# Patient Record
Sex: Female | Born: 1970 | Race: White | Hispanic: No | Smoking: Never smoker
Health system: Southern US, Community
[De-identification: ages and names within clinical notes are randomized; demographics above are authoritative.]

## PROBLEM LIST (undated history)

## (undated) DIAGNOSIS — Z803 Family history of malignant neoplasm of breast: Secondary | ICD-10-CM

## (undated) DIAGNOSIS — E611 Iron deficiency: Secondary | ICD-10-CM

## (undated) DIAGNOSIS — Z973 Presence of spectacles and contact lenses: Secondary | ICD-10-CM

## (undated) HISTORY — PX: TUBAL LIGATION: SHX77

## (undated) HISTORY — PX: WISDOM TOOTH EXTRACTION: SHX21

## (undated) HISTORY — DX: Family history of malignant neoplasm of breast: Z80.3

## (undated) HISTORY — PX: TONSILLECTOMY: SUR1361

---

## 2007-11-22 ENCOUNTER — Ambulatory Visit: Payer: Self-pay | Admitting: Internal Medicine

## 2012-06-07 ENCOUNTER — Ambulatory Visit: Payer: Self-pay | Admitting: Internal Medicine

## 2013-10-30 ENCOUNTER — Ambulatory Visit: Payer: Self-pay | Admitting: Physician Assistant

## 2016-11-03 ENCOUNTER — Other Ambulatory Visit: Payer: Self-pay | Admitting: Obstetrics & Gynecology

## 2016-11-03 DIAGNOSIS — Z1231 Encounter for screening mammogram for malignant neoplasm of breast: Secondary | ICD-10-CM

## 2016-11-04 ENCOUNTER — Ambulatory Visit
Admission: RE | Admit: 2016-11-04 | Discharge: 2016-11-04 | Disposition: A | Discharge status: Home / Self Care | Payer: 59 | Source: Ambulatory Visit | Attending: Obstetrics & Gynecology | Admitting: Obstetrics & Gynecology

## 2016-11-04 ENCOUNTER — Encounter: Payer: Self-pay | Admitting: Radiology

## 2016-11-04 DIAGNOSIS — Z1231 Encounter for screening mammogram for malignant neoplasm of breast: Secondary | ICD-10-CM | POA: Diagnosis not present

## 2016-12-23 ENCOUNTER — Encounter: Payer: Self-pay | Admitting: *Deleted

## 2016-12-23 ENCOUNTER — Ambulatory Visit
Admission: EM | Admit: 2016-12-23 | Discharge: 2016-12-23 | Disposition: A | Discharge status: Home / Self Care | Payer: 59 | Attending: Family Medicine | Admitting: Family Medicine

## 2016-12-23 DIAGNOSIS — H6123 Impacted cerumen, bilateral: Secondary | ICD-10-CM

## 2016-12-23 NOTE — ED Triage Notes (Signed)
Right ear impaction. Pt has previous hx of wax impactions.

## 2016-12-23 NOTE — ED Provider Notes (Signed)
[facility]    CSN: [id number] Arrival date & time: 12/23/16  1121     History   Chief Complaint Chief Complaint  Patient presents with  . Cerumen Impaction    HPI Jamie Higgins is a 46 y.o. female.   46 yo female with a c/o "plugged up ears". Denies any pain or drainage. States has recurrent problem of ear wax build up in ear canals.    The history is provided by the patient.    History reviewed. No pertinent past medical history.  There are no active problems to display for this patient.   Past Surgical History:  Procedure Laterality Date  . CESAREAN SECTION      OB History    No data available       Home Medications    Prior to Admission medications   Not on File    Family History History reviewed. No pertinent family history.  Social History Social History  Substance Use Topics  . Smoking status: Never Smoker  . Smokeless tobacco: Never Used  . Alcohol use Yes     Allergies   Patient has no known allergies.   Review of Systems Review of Systems   Physical Exam Triage Vital Signs ED Triage Vitals  Enc Vitals Group     BP 12/23/16 1127 124/83     Pulse Rate 12/23/16 1127 66     Resp 12/23/16 1127 16     Temp 12/23/16 1127 98.1 F (36.7 C)     Temp Source 12/23/16 1127 Oral     SpO2 12/23/16 1127 100 %     Weight 12/23/16 1129 160 lb (72.6 kg)     Height 12/23/16 1129 5\' 3"  (1.6 m)     Head Circumference --      Peak Flow --      Pain Score --      Pain Loc --      Pain Edu? --      Excl. in GC? --    No data found.   Updated Vital Signs BP 124/83 (BP Location: Left Arm)   Pulse 66   Temp 98.1 F (36.7 C) (Oral)   Resp 16   Ht 5\' 3"  (1.6 m)   Wt 160 lb (72.6 kg)   LMP 12/15/2016 (Exact Date)   SpO2 100%   BMI 28.34 kg/m   Visual Acuity Right Eye Distance:   Left Eye Distance:   Bilateral Distance:    Right Eye Near:   Left Eye Near:    Bilateral Near:     Physical Exam  Constitutional: She  appears well-developed and well-nourished.  HENT:  Head: Normocephalic and atraumatic.  Cerumen impaction bilaterally  Nursing note and vitals reviewed.    UC Treatments / Results  Labs (all labs ordered are listed, but only abnormal results are displayed) Labs Reviewed - No data to display  EKG  EKG Interpretation None       Radiology No results found.  Procedures .Ear Cerumen Removal Date/Time: 12/23/2016 12:18 PM Performed by: Payton MccallumONTY, Charnette Younkin Authorized by: Payton MccallumONTY, Salle Brandle   Consent:    Consent obtained:  Verbal   Consent given by:  Patient   Risks discussed:  Bleeding, pain, TM perforation and dizziness   Alternatives discussed:  No treatment Procedure details:    Location:  L ear and R ear   Procedure type: irrigation   Post-procedure details:    Inspection:  TM intact   Patient tolerance of procedure:  Tolerated well, no immediate complications   (including critical care time)  Medications Ordered in UC Medications - No data to display   Initial Impression / Assessment and Plan / UC Course  I have reviewed the triage vital signs and the nursing notes.  Pertinent labs & imaging results that were available during my care of the patient were reviewed by me and considered in my medical decision making (see chart for details).       Final Clinical Impressions(s) / UC Diagnoses   Final diagnoses:  Bilateral impacted cerumen    New Prescriptions There are no discharge medications for this patient.  1. diagnosis reviewed with patient 2. Ear lavage/irrigation as stated above; TMs visualized and clear after cerumen disimpaction 4. Follow-up prn    Payton Mccallum, MD 12/23/16 1220

## 2017-03-17 ENCOUNTER — Ambulatory Visit: Payer: Self-pay | Admitting: Obstetrics & Gynecology

## 2017-05-19 ENCOUNTER — Ambulatory Visit: Payer: Self-pay | Admitting: Obstetrics & Gynecology

## 2017-06-03 ENCOUNTER — Other Ambulatory Visit: Payer: Self-pay | Admitting: Obstetrics & Gynecology

## 2017-06-07 ENCOUNTER — Telehealth: Payer: Self-pay

## 2017-06-07 NOTE — Telephone Encounter (Signed)
Pt has appt next week c PH.  Would like to have refill of phentermine before then to try to get a jump on some wt loss before appt.  Please call.  [phone]

## 2017-06-07 NOTE — Telephone Encounter (Signed)
This medicine is Rx by pick up only (not able to fax or call).  Routine is to provide Rx at office visit.  Can pick up before then if desired (although waiting is preffered)

## 2017-06-07 NOTE — Telephone Encounter (Signed)
Pt aware and will wait

## 2017-06-14 ENCOUNTER — Encounter: Payer: Self-pay | Admitting: Obstetrics & Gynecology

## 2017-06-14 ENCOUNTER — Ambulatory Visit (INDEPENDENT_AMBULATORY_CARE_PROVIDER_SITE_OTHER): Payer: 59 | Admitting: Obstetrics & Gynecology

## 2017-06-14 VITALS — BP 120/80 | HR 76 | Ht 63.0 in | Wt 174.0 lb

## 2017-06-14 DIAGNOSIS — Z1211 Encounter for screening for malignant neoplasm of colon: Secondary | ICD-10-CM | POA: Diagnosis not present

## 2017-06-14 DIAGNOSIS — Z1239 Encounter for other screening for malignant neoplasm of breast: Secondary | ICD-10-CM

## 2017-06-14 DIAGNOSIS — E669 Obesity, unspecified: Secondary | ICD-10-CM

## 2017-06-14 DIAGNOSIS — Z01419 Encounter for gynecological examination (general) (routine) without abnormal findings: Secondary | ICD-10-CM | POA: Diagnosis not present

## 2017-06-14 DIAGNOSIS — Z0000 Encounter for general adult medical examination without abnormal findings: Secondary | ICD-10-CM

## 2017-06-14 DIAGNOSIS — R87612 Low grade squamous intraepithelial lesion on cytologic smear of cervix (LGSIL): Secondary | ICD-10-CM | POA: Diagnosis not present

## 2017-06-14 DIAGNOSIS — Z1231 Encounter for screening mammogram for malignant neoplasm of breast: Secondary | ICD-10-CM

## 2017-06-14 MED ORDER — PHENTERMINE HCL 37.5 MG PO TABS: 37.5000 mg | ORAL_TABLET | Freq: Every day | ORAL | 1 refills | Status: DC

## 2017-06-14 NOTE — Progress Notes (Signed)
HPI:      Jamie Higgins is a 46 y.o. [id number] who LMP was Patient's last menstrual period was 05/20/2017., she presents today for her annual examination. The patient has no complaints today. The patient is sexually active. Her last pap: approximate date 2017 and was abnormal: LGSIL (and has had prior LGSIL w Neg HPV in 2014,15,16; Colpo Bx in 2016 neg) and last mammogram: approximate date 10/2016 and was normal. The patient does perform self breast exams.  There is no notable family history of breast or ovarian cancer in her family.  The patient has regular exercise: yes.  The patient denies current symptoms of depression.    GYN History: Contraception: tubal ligation  PMHx: History reviewed. No pertinent past medical history. Past Surgical History:  Procedure Laterality Date  . CESAREAN SECTION    . TUBAL LIGATION     Family History  Problem Relation Age of Onset  . Breast cancer Father    Social History  Substance Use Topics  . Smoking status: Never Smoker  . Smokeless tobacco: Never Used  . Alcohol use Yes    Current Outpatient Prescriptions:  .  phentermine (ADIPEX-P) 37.5 MG tablet, Take 1 tablet (37.5 mg total) by mouth daily before breakfast., Disp: 30 tablet, Rfl: 1 Allergies: Patient has no known allergies.  Review of Systems  Constitutional: Negative for chills, fever and malaise/fatigue.  HENT: Negative for congestion, sinus pain and sore throat.   Eyes: Negative for blurred vision and pain.  Respiratory: Negative for cough and wheezing.   Cardiovascular: Negative for chest pain and leg swelling.  Gastrointestinal: Negative for abdominal pain, constipation, diarrhea, heartburn, nausea and vomiting.  Genitourinary: Negative for dysuria, frequency, hematuria and urgency.  Musculoskeletal: Negative for back pain, joint pain, myalgias and neck pain.  Skin: Negative for itching and rash.  Neurological: Negative for dizziness, tremors and weakness.    Endo/Heme/Allergies: Does not bruise/bleed easily.  Psychiatric/Behavioral: Negative for depression. The patient is not nervous/anxious and does not have insomnia.     Objective: BP 120/80   Pulse 76   Ht 5\' 3"  (1.6 m)   Wt 174 lb (78.9 kg)   LMP 05/20/2017   BMI 30.82 kg/m   Filed Weights   06/14/17 1042  Weight: 174 lb (78.9 kg)   Body mass index is 30.82 kg/m. Physical Exam  Constitutional: She is oriented to person, place, and time. She appears well-developed and well-nourished. No distress.  Genitourinary: Rectum normal, vagina normal and uterus normal. Pelvic exam was performed with patient supine. There is no rash or lesion on the right labia. There is no rash or lesion on the left labia. Vagina exhibits no lesion. No bleeding in the vagina. Right adnexum does not display mass and does not display tenderness. Left adnexum does not display mass and does not display tenderness. Cervix does not exhibit motion tenderness, lesion, friability or polyp.   Uterus is mobile and midaxial. Uterus is not enlarged or exhibiting a mass.  HENT:  Head: Normocephalic and atraumatic. Head is without laceration.  Right Ear: Hearing normal.  Left Ear: Hearing normal.  Nose: No epistaxis.  No foreign bodies.  Mouth/Throat: Uvula is midline, oropharynx is clear and moist and mucous membranes are normal.  Eyes: Pupils are equal, round, and reactive to light.  Neck: Normal range of motion. Neck supple. No thyromegaly present.  Cardiovascular: Normal rate and regular rhythm.  Exam reveals no gallop and no friction rub.   No murmur heard. Pulmonary/Chest:  Effort normal and breath sounds normal. No respiratory distress. She has no wheezes. Right breast exhibits no mass, no skin change and no tenderness. Left breast exhibits no mass, no skin change and no tenderness.  Abdominal: Soft. Bowel sounds are normal. She exhibits no distension. There is no tenderness. There is no rebound.  Musculoskeletal:  Normal range of motion.  Neurological: She is alert and oriented to person, place, and time. No cranial nerve deficit.  Skin: Skin is warm and dry.  Psychiatric: She has a normal mood and affect. Judgment normal.  Vitals reviewed.   Assessment:  ANNUAL EXAM 1. Annual physical exam   2. Obesity (BMI 30-39.9)   3. Screening for breast cancer   4. Screen for colon cancer   5. Low grade squamous intraepith lesion on cytologic smear cervix (lgsil)      Screening Plan:            1.  Cervical Screening-  Pap smear done today           Prior LGSIL for years           Colpo 2016 neg  2. Breast screening- Exam annually and mammogram>40 planned   3. Colonoscopy every 10 years, Hemoccult testing - after age 47  4. Labs Normal last year; repeat next year  5. Counseling for contraception: bilateral tubal ligation  Other:  1. Annual physical exam  2. Obesity (BMI 30-39.9) Phentermine discussed an described. Weight loss, diet, exercise, follow up  3. Screening for breast cancer - MM DIGITAL SCREENING BILATERAL; Future  4. Screen for colon cancer - Ambulatory referral to Gastroenterology  5. Low grade squamous intraepith lesion on cytologic smear cervix (lgsil) - Pap IG (Image Guided)    F/U  Return in about 2 months (around 08/15/2017) for Follow up.  Annamarie MajorPaul Higgins Faye, MD, Merlinda FrederickFACOG [facility], [facility] 06/14/2017  11:09 AM

## 2017-06-14 NOTE — Patient Instructions (Signed)
PAP every year Mammogram every year    Call [phone] next year

## 2017-06-18 LAB — PAP IG (IMAGE GUIDED): PAP Smear Comment: 0

## 2017-06-25 ENCOUNTER — Telehealth: Payer: Self-pay

## 2017-06-25 ENCOUNTER — Other Ambulatory Visit: Payer: Self-pay

## 2017-06-25 DIAGNOSIS — Z1211 Encounter for screening for malignant neoplasm of colon: Secondary | ICD-10-CM

## 2017-06-25 NOTE — Telephone Encounter (Signed)
Gastroenterology Pre-Procedure Review  Request Date: 08/16 Requesting Physician: Dr. Servando Snarewohl  PATIENT REVIEW QUESTIONS: The patient responded to the following health history questions as indicated:    1. Are you having any GI issues? no 2. Do you have a personal history of Polyps? no 3. Do you have a family history of Colon Cancer or Polyps? no 4. Diabetes Mellitus? no 5. Joint replacements in the past 12 months?no 6. Major health problems in the past 3 months?no 7. Any artificial heart valves, MVP, or defibrillator?no    MEDICATIONS & ALLERGIES:    Patient reports the following regarding taking any anticoagulation/antiplatelet therapy:   Plavix, Coumadin, Eliquis, Xarelto, Lovenox, Pradaxa, Brilinta, or Effient? no Aspirin? no  Patient confirms/reports the following medications:  Current Outpatient Prescriptions  Medication Sig Dispense Refill  . phentermine (ADIPEX-P) 37.5 MG tablet Take 1 tablet (37.5 mg total) by mouth daily before breakfast. 30 tablet 1   No current facility-administered medications for this visit.     Patient confirms/reports the following allergies:  No Known Allergies  No orders of the defined types were placed in this encounter.   AUTHORIZATION INFORMATION Primary Insurance: 1D#: Group #:  Secondary Insurance: 1D#: Group #:  SCHEDULE INFORMATION: Date: 07/15/17 Time: Location:[facility]

## 2017-06-30 ENCOUNTER — Encounter: Payer: Self-pay | Admitting: Obstetrics and Gynecology

## 2017-07-07 ENCOUNTER — Encounter: Payer: Self-pay | Admitting: *Deleted

## 2017-07-08 ENCOUNTER — Encounter: Payer: Self-pay | Admitting: Anesthesiology

## 2017-07-15 ENCOUNTER — Ambulatory Visit: Admission: RE | Admit: 2017-07-15 | Payer: 59 | Source: Ambulatory Visit | Admitting: Gastroenterology

## 2017-07-15 HISTORY — DX: Iron deficiency: E61.1

## 2017-07-15 HISTORY — DX: Presence of spectacles and contact lenses: Z97.3

## 2017-07-15 SURGERY — COLONOSCOPY WITH PROPOFOL
Anesthesia: Choice

## 2017-08-17 ENCOUNTER — Ambulatory Visit: Payer: 59 | Admitting: Obstetrics & Gynecology

## 2018-01-21 ENCOUNTER — Encounter: Payer: Self-pay | Admitting: Obstetrics & Gynecology

## 2018-01-21 ENCOUNTER — Ambulatory Visit (INDEPENDENT_AMBULATORY_CARE_PROVIDER_SITE_OTHER): Payer: Managed Care, Other (non HMO) | Admitting: Obstetrics & Gynecology

## 2018-01-21 VITALS — BP 120/80 | HR 70 | Ht 63.0 in | Wt 172.0 lb

## 2018-01-21 DIAGNOSIS — Z87410 Personal history of cervical dysplasia: Secondary | ICD-10-CM | POA: Diagnosis not present

## 2018-01-21 DIAGNOSIS — Z1231 Encounter for screening mammogram for malignant neoplasm of breast: Secondary | ICD-10-CM

## 2018-01-21 DIAGNOSIS — Z0000 Encounter for general adult medical examination without abnormal findings: Secondary | ICD-10-CM | POA: Diagnosis not present

## 2018-01-21 DIAGNOSIS — Z1239 Encounter for other screening for malignant neoplasm of breast: Secondary | ICD-10-CM

## 2018-01-21 MED ORDER — PHENTERMINE HCL 37.5 MG PO TABS: 37.5000 mg | ORAL_TABLET | Freq: Every day | ORAL | 1 refills | Status: DC

## 2018-01-21 NOTE — Patient Instructions (Addendum)
PAP every year Mammogram every year    Call [phone] to schedule at [facility] Colonoscopy every 10 years starting age 48 Labs yearly (with PCP)  Phentermine tablets or capsules What is this medicine? PHENTERMINE (FEN ter meen) decreases your appetite. It is used with a reduced calorie diet and exercise to help you lose weight. This medicine may be used for other purposes; ask your health care provider or pharmacist if you have questions. COMMON BRAND NAME(S): Adipex-P, Atti-Plex P, Atti-Plex P Spansule, Fastin, Lomaira, Pro-Fast, Tara-8 What should I tell my health care provider before I take this medicine? They need to know if you have any of these conditions: -agitation -glaucoma -heart disease -high blood pressure -history of substance abuse -lung disease called Primary Pulmonary Hypertension (PPH) -taken an MAOI like Carbex, Eldepryl, Marplan, Nardil, or Parnate in last 14 days -thyroid disease -an unusual or allergic reaction to phentermine, other medicines, foods, dyes, or preservatives -pregnant or trying to get pregnant -breast-feeding How should I use this medicine? Take this medicine by mouth with a glass of water. Follow the directions on the prescription label. The instructions for use may differ based on the product and dose you are taking. Avoid taking this medicine in the evening. It may interfere with sleep. Take your doses at regular intervals. Do not take your medicine more often than directed. Talk to your pediatrician regarding the use of this medicine in children. While this drug may be prescribed for children 17 years or older for selected conditions, precautions do apply. Overdosage: If you think you have taken too much of this medicine contact a poison control center or emergency room at once. NOTE: This medicine is only for you. Do not share this medicine with others. What if I miss a dose? If you miss a dose, take it as soon as you can. If it is  almost time for your next dose, take only that dose. Do not take double or extra doses. What may interact with this medicine? Do not take this medicine with any of the following medications: -duloxetine -MAOIs like Carbex, Eldepryl, Marplan, Nardil, and Parnate -medicines for colds or breathing difficulties like pseudoephedrine or phenylephrine -procarbazine -sibutramine -SSRIs like citalopram, escitalopram, fluoxetine, fluvoxamine, paroxetine, and sertraline -stimulants like dexmethylphenidate, methylphenidate or modafinil -venlafaxine This medicine may also interact with the following medications: -medicines for diabetes This list may not describe all possible interactions. Give your health care provider a list of all the medicines, herbs, non-prescription drugs, or dietary supplements you use. Also tell them if you smoke, drink alcohol, or use illegal drugs. Some items may interact with your medicine. What should I watch for while using this medicine? Notify your physician immediately if you become short of breath while doing your normal activities. Do not take this medicine within 6 hours of bedtime. It can keep you from getting to sleep. Avoid drinks that contain caffeine and try to stick to a regular bedtime every night. This medicine was intended to be used in addition to a healthy diet and exercise. The best results are achieved this way. This medicine is only indicated for short-term use. Eventually your weight loss may level out. At that point, the drug will only help you maintain your new weight. Do not increase or in any way change your dose without consulting your doctor. You may get drowsy or dizzy. Do not drive, use machinery, or do anything that needs mental alertness until you know how this medicine affects you. Do not stand or  sit up quickly, especially if you are an older patient. This reduces the risk of dizzy or fainting spells. Alcohol may increase dizziness and drowsiness.  Avoid alcoholic drinks. What side effects may I notice from receiving this medicine? Side effects that you should report to your doctor or health care professional as soon as possible: -chest pain, palpitations -depression or severe changes in mood -increased blood pressure -irritability -nervousness or restlessness -severe dizziness -shortness of breath -problems urinating -unusual swelling of the legs -vomiting Side effects that usually do not require medical attention (report to your doctor or health care professional if they continue or are bothersome): -blurred vision or other eye problems -changes in sexual ability or desire -constipation or diarrhea -difficulty sleeping -dry mouth or unpleasant taste -headache -nausea This list may not describe all possible side effects. Call your doctor for medical advice about side effects. You may report side effects to FDA at 1-800-FDA-1088. Where should I keep my medicine? Keep out of the reach of children. This medicine can be abused. Keep your medicine in a safe place to protect it from theft. Do not share this medicine with anyone. Selling or giving away this medicine is dangerous and against the law. This medicine may cause accidental overdose and death if taken by other adults, children, or pets. Mix any unused medicine with a substance like cat litter or coffee grounds. Then throw the medicine away in a sealed container like a sealed bag or a coffee can with a lid. Do not use the medicine after the expiration date. Store at room temperature between 20 and 25 degrees C (68 and 77 degrees F). Keep container tightly closed. NOTE: This sheet is a summary. It may not cover all possible information. If you have questions about this medicine, talk to your doctor, pharmacist, or health care provider.  2018 Elsevier/Gold Standard (2015-08-23 12:53:15)

## 2018-01-21 NOTE — Progress Notes (Signed)
   HPI:      [name] is a 47 y.o. [id number] who LMP was Patient's last menstrual period was 12/24/2017., she presents today for her annual examination. The patient has no complaints today, except WEIGHT GAIN and SPACING OUT PERIODS. The patient is sexually active. Her last pap: approximate date 2018 and was abnormal: ASCUS;  and 2017was abnormal: LGSIL (and has had prior LGSIL w Neg HPV in 2014,15,16; Colpo Bx in 2016 neg and last mammogram: approximate date 2017 and was normal. The patient does perform self breast exams.  There is no notable family history of breast or ovarian cancer in her family.  The patient has regular exercise: yes.  The patient denies current symptoms of depression.    GYN History: Contraception: tubal ligation  PMHx: Past Medical History:  Diagnosis Date  . Family history of breast cancer in female    genetic tesing letter sent 6/16, 8/18  . Low iron   . Wears contact lenses    Past Surgical History:  Procedure Laterality Date  . CESAREAN SECTION    . TUBAL LIGATION     Family History  Problem Relation Age of Onset  . Breast cancer Father    Social History   Tobacco Use  . Smoking status: Never Smoker  . Smokeless tobacco: Never Used  Substance Use Topics  . Alcohol use: Yes    Alcohol/week: 3.6 oz    Types: 6 Cans of beer per week  . Drug use: No    Current Outpatient Medications:  .  phentermine (ADIPEX-P) 37.5 MG tablet, Take 1 tablet (37.5 mg total) by mouth daily before breakfast., Disp: 30 tablet, Rfl: 1 Allergies: Patient has no known allergies.  ROS  Objective: BP 120/80   Pulse 70   Ht 5\' 3"  (1.6 m)   Wt 172 lb (78 kg)   LMP 12/24/2017   BMI 30.47 kg/m   Filed Weights   01/21/18 1324  Weight: 172 lb (78 kg)   Body mass index is 30.47 kg/m. OBGyn Exam  Assessment:  ANNUAL EXAM 1. Annual physical exam   2. Screening for breast cancer   3. History of cervical dysplasia      Screening Plan:            1.   Cervical Screening-  Pap smear done today  2. Breast screening- Exam annually and mammogram>40 planned   3. Colonoscopy every 10 years, Hemoccult testing - after age 48  4. Labs due next year  5. Weight gain, BMI 30.  Lifestyle measures discussed. Meds also discussed. Phentermine pros and cons  6. Monitor oligomenorrhea, related to perimenopause    F/U  Return in about 1 year (around 01/21/2019) for Annual.  Annamarie MajorPaul Harris, MD, Merlinda FrederickFACOG [facility], [facility] 01/21/2018  2:00 PM

## 2018-01-25 LAB — IGP, APTIMA HPV
HPV Aptima: NEGATIVE
PAP Smear Comment: 0

## 2018-02-04 ENCOUNTER — Ambulatory Visit: Payer: 59 | Admitting: Obstetrics & Gynecology

## 2018-02-08 ENCOUNTER — Encounter: Payer: Self-pay | Admitting: Obstetrics and Gynecology

## 2018-03-17 ENCOUNTER — Encounter: Payer: Self-pay | Admitting: Obstetrics & Gynecology

## 2018-03-17 ENCOUNTER — Ambulatory Visit (INDEPENDENT_AMBULATORY_CARE_PROVIDER_SITE_OTHER): Payer: Managed Care, Other (non HMO) | Admitting: Obstetrics & Gynecology

## 2018-03-17 ENCOUNTER — Ambulatory Visit: Payer: Managed Care, Other (non HMO) | Admitting: Obstetrics & Gynecology

## 2018-03-17 VITALS — BP 120/80 | Ht 63.0 in | Wt 156.0 lb

## 2018-03-17 DIAGNOSIS — E663 Overweight: Secondary | ICD-10-CM | POA: Diagnosis not present

## 2018-03-17 MED ORDER — PHENTERMINE HCL 37.5 MG PO TABS: 37.5000 mg | ORAL_TABLET | Freq: Every day | ORAL | 1 refills | Status: DC

## 2018-03-17 NOTE — Progress Notes (Signed)
  History of Present Illness:  Jamie Higgins is a 47 y.o. who was started on Phentermine approximately 2 months ago due to obesity/abnormal weight gain. The patient has lost 12 pounds over the past 2 mos due to lifestyle changes and meds..   She has these side effects: none.  PMHx: She  has a past medical history of Family history of breast cancer in female, Low iron, and Wears contact lenses. Also,  has a past surgical history that includes Cesarean section and Tubal ligation., family history includes Breast cancer (age of onset: 6160) in her father.,  reports that she has never smoked. She has never used smokeless tobacco. She reports that she drinks about 3.6 oz of alcohol per week. She reports that she does not use drugs.  She has a current medication list which includes the following prescription(s): phentermine. Also, has No Known Allergies.  Review of Systems  All other systems reviewed and are negative.  Physical Exam:  BP 120/80   Ht 5\' 3"  (1.6 m)   Wt 156 lb (70.8 kg)   LMP 03/11/2018   BMI 27.63 kg/m  Body mass index is 27.63 kg/m. Filed Weights   03/17/18 1607  Weight: 156 lb (70.8 kg)   Physical Exam  Constitutional: She is oriented to person, place, and time. She appears well-developed and well-nourished. No distress.  Musculoskeletal: Normal range of motion.  Neurological: She is alert and oriented to person, place, and time.  Skin: Skin is warm and dry.  Psychiatric: She has a normal mood and affect.  Vitals reviewed.  Assessment: overweight Medication treatment is going very well for her.  Plan: Patient is continued/added to prescription appetite suppressants: PHENTERMINE and self-directed dieting.   Will continue to assist patient in incorporating positive experiences into her life to promote a positive mental attitude.  Education given regarding appropriate lifestyle changes for weight loss, including regular physical activity, healthy coping strategies, caloric  restriction, and healthy eating patterns.  The risks and benefits as well as side effects of medication, such as Phenteramine or Tenuate, is discussed.  The pros and cons of suppressing appetite and boosting metabolism is counseled.  Risks of tolerance and addiction discussed.  Use of medicine will be short term.  Pt to call with any negative side effects and agrees to keep follow up appointments.  Will stop after 2 more mos of therapy  A total of 15 minutes were spent face-to-face with the patient during this encounter and over half of that time dealt with counseling and coordination of care.  Annamarie MajorPaul Burgandy Hackworth, MD, Merlinda FrederickFACOG [facility], [facility] 03/17/2018  4:24 PM

## 2018-04-26 ENCOUNTER — Telehealth: Payer: Self-pay

## 2018-04-26 NOTE — Telephone Encounter (Signed)
-----   Message from Nadara Mustard, MD sent at 04/26/2018 11:09 AM EDT ----- Regarding: MMG Received notice she has not received MMG yet as ordered at her Annual. Please check and encourage her to do this, and document conversation.

## 2018-04-26 NOTE — Telephone Encounter (Signed)
Pt states she will have her mammo done when school is out. She thanks [place] for the reminder

## 2018-08-02 ENCOUNTER — Telehealth: Payer: Self-pay

## 2018-08-02 NOTE — Telephone Encounter (Signed)
Pt aware she needs to schedule her mammo

## 2018-08-02 NOTE — Telephone Encounter (Signed)
-----   Message from Nadara Mustard, MD sent at 08/02/2018  9:47 AM EDT ----- Regarding: MMG again Received notice she has not received MMG yet as ordered at her Annual. Please check and encourage her to do this, and document conversation.

## 2018-08-17 ENCOUNTER — Ambulatory Visit
Admission: RE | Admit: 2018-08-17 | Discharge: 2018-08-17 | Disposition: A | Discharge status: Home / Self Care | Payer: Managed Care, Other (non HMO) | Source: Ambulatory Visit | Attending: Obstetrics & Gynecology | Admitting: Obstetrics & Gynecology

## 2018-08-17 DIAGNOSIS — Z1239 Encounter for other screening for malignant neoplasm of breast: Secondary | ICD-10-CM

## 2018-08-17 DIAGNOSIS — Z1231 Encounter for screening mammogram for malignant neoplasm of breast: Secondary | ICD-10-CM | POA: Insufficient documentation

## 2019-01-23 ENCOUNTER — Ambulatory Visit (INDEPENDENT_AMBULATORY_CARE_PROVIDER_SITE_OTHER): Payer: Managed Care, Other (non HMO) | Admitting: Obstetrics & Gynecology

## 2019-01-23 ENCOUNTER — Other Ambulatory Visit (HOSPITAL_COMMUNITY)
Admission: RE | Admit: 2019-01-23 | Discharge: 2019-01-23 | Disposition: A | Discharge status: Home / Self Care | Payer: Managed Care, Other (non HMO) | Source: Ambulatory Visit | Attending: Obstetrics & Gynecology | Admitting: Obstetrics & Gynecology

## 2019-01-23 ENCOUNTER — Encounter: Payer: Self-pay | Admitting: Obstetrics & Gynecology

## 2019-01-23 VITALS — BP 118/80 | Ht 63.0 in | Wt 183.0 lb

## 2019-01-23 DIAGNOSIS — Z01419 Encounter for gynecological examination (general) (routine) without abnormal findings: Secondary | ICD-10-CM

## 2019-01-23 DIAGNOSIS — Z87410 Personal history of cervical dysplasia: Secondary | ICD-10-CM

## 2019-01-23 DIAGNOSIS — Z1239 Encounter for other screening for malignant neoplasm of breast: Secondary | ICD-10-CM

## 2019-01-23 MED ORDER — PHENTERMINE HCL 37.5 MG PO TABS: 37.5000 mg | ORAL_TABLET | Freq: Every day | ORAL | 1 refills | Status: DC

## 2019-01-23 NOTE — Patient Instructions (Signed)
PAP every year Mammogram every year    Call [phone] to schedule at [place] [facility] yearly (with PCP)   

## 2019-01-23 NOTE — Progress Notes (Signed)
HPI:      Jamie Higgins is a 48 y.o. [id number] who LMP was Patient's last menstrual period was 10/21/2018., she presents today for her annual examination. The patient has no complaints today. The patient is sexually active. Her last pap: approximate date 2019 and was normal and prior ASCUS 2018, LGSIL 2014-2017, Colpo/Bx 2016; and last mammogram: approximate date 07/2018 and was normal. The patient does perform self breast exams.  There is no notable family history of breast or ovarian cancer in her family.  The patient has regular exercise: yes.  The patient denies current symptoms of depression.  Occas period (2-3 per year); mild night sweats.  Pt reports weight gain every winter (30-40 lbs from baseline 140s in the summer).  Phentermine has helped, also better diet practices and exercise routine.  GYN History: Contraception: tubal ligation  PMHx: Past Medical History:  Diagnosis Date  . Family history of breast cancer in female    genetic tesing letter sent 6/16, 8/18  . Low iron   . Wears contact lenses    Past Surgical History:  Procedure Laterality Date  . CESAREAN SECTION    . TONSILLECTOMY    . TUBAL LIGATION    . WISDOM TOOTH EXTRACTION     Family History  Problem Relation Age of Onset  . Breast cancer Father 68   Social History   Tobacco Use  . Smoking status: Never Smoker  . Smokeless tobacco: Never Used  Substance Use Topics  . Alcohol use: Yes    Alcohol/week: 6.0 standard drinks    Types: 6 Cans of beer per week  . Drug use: No    Current Outpatient Medications:  .  phentermine (ADIPEX-P) 37.5 MG tablet, Take 1 tablet (37.5 mg total) by mouth daily before breakfast. (Patient not taking: Reported on 01/23/2019), Disp: 30 tablet, Rfl: 1 Allergies: Patient has no known allergies.  Review of Systems  Constitutional: Negative for chills, fever and malaise/fatigue.  HENT: Negative for congestion, sinus pain and sore throat.   Eyes: Negative for blurred vision  and pain.  Respiratory: Negative for cough and wheezing.   Cardiovascular: Negative for chest pain and leg swelling.  Gastrointestinal: Negative for abdominal pain, constipation, diarrhea, heartburn, nausea and vomiting.  Genitourinary: Negative for dysuria, frequency, hematuria and urgency.  Musculoskeletal: Negative for back pain, joint pain, myalgias and neck pain.  Skin: Negative for itching and rash.  Neurological: Negative for dizziness, tremors and weakness.  Endo/Heme/Allergies: Does not bruise/bleed easily.  Psychiatric/Behavioral: Negative for depression. The patient is not nervous/anxious and does not have insomnia.     Objective: BP 118/80   Ht 5\' 3"  (1.6 m)   Wt 183 lb (83 kg)   LMP 10/21/2018   BMI 32.42 kg/m   Filed Weights   01/23/19 0957  Weight: 183 lb (83 kg)   Body mass index is 32.42 kg/m. Physical Exam Constitutional:      General: She is not in acute distress.    Appearance: She is well-developed.  Genitourinary:     Pelvic exam was performed with patient supine.     Vagina, uterus and rectum normal.     No lesions in the vagina.     No vaginal bleeding.     No cervical motion tenderness, friability, lesion or polyp.     Uterus is mobile.     Uterus is not enlarged.     No uterine mass detected.    Uterus is midaxial.  No right or left adnexal mass present.     Right adnexa not tender.     Left adnexa not tender.  HENT:     Head: Normocephalic and atraumatic. No laceration.     Right Ear: Hearing normal.     Left Ear: Hearing normal.     Mouth/Throat:     Pharynx: Uvula midline.  Eyes:     Pupils: Pupils are equal, round, and reactive to light.  Neck:     Musculoskeletal: Normal range of motion and neck supple.     Thyroid: No thyromegaly.  Cardiovascular:     Rate and Rhythm: Normal rate and regular rhythm.     Heart sounds: No murmur. No friction rub. No gallop.   Pulmonary:     Effort: Pulmonary effort is normal. No respiratory  distress.     Breath sounds: Normal breath sounds. No wheezing.  Chest:     Breasts:        Right: No mass, skin change or tenderness.        Left: No mass, skin change or tenderness.  Abdominal:     General: Bowel sounds are normal. There is no distension.     Palpations: Abdomen is soft.     Tenderness: There is no abdominal tenderness. There is no rebound.  Musculoskeletal: Normal range of motion.  Neurological:     Mental Status: She is alert and oriented to person, place, and time.     Cranial Nerves: No cranial nerve deficit.  Skin:    General: Skin is warm and dry.  Psychiatric:        Judgment: Judgment normal.  Vitals signs reviewed.   Assessment:  ANNUAL EXAM 1. Women's annual routine gynecological examination   2. Screening for breast cancer   3. History of cervical dysplasia    Screening Plan:            1.  Cervical Screening-  Pap smear done today as has history of cervical dysplasia  2. Breast screening- Exam annually and mammogram>40 planned   3. Colonoscopy every 10 years, Hemoccult testing - after age 50  4. Labs managed by PCP  5. Counseling for contraception: bilateral tubal ligation   6. Weight gain, counseled on better routines. Phentermine 2 mos Rx.    F/U  Return in about 1 year (around 01/24/2020) for Annual.  Annamarie Major, MD, [facility], [facility] 01/23/2019  10:24 AM

## 2019-01-25 LAB — CYTOLOGY - PAP

## 2019-01-26 NOTE — Progress Notes (Signed)
PAP LGSIL (history of this), so plan repeat PAP in 6 months.  Pt reassured.  [place] Dec, please sch follow up appt in August please.  She is aware you will  be calling

## 2019-01-27 ENCOUNTER — Telehealth: Payer: Self-pay | Admitting: Obstetrics & Gynecology

## 2019-01-27 NOTE — Telephone Encounter (Signed)
Patient is schedule 07/06/19 with [facility] °

## 2019-01-27 NOTE — Telephone Encounter (Signed)
-----   Message from Nadara Mustard, MD sent at 01/26/2019  7:34 AM EST ----- PAP LGSIL (history of this), so plan repeat PAP in 6 months.  Pt reassured.  [place] Dec, please sch follow up appt in August please.  She is aware you will  be calling

## 2019-04-11 ENCOUNTER — Encounter: Payer: Self-pay | Admitting: Obstetrics & Gynecology

## 2019-04-11 ENCOUNTER — Other Ambulatory Visit: Payer: Self-pay | Admitting: Obstetrics & Gynecology

## 2019-04-11 DIAGNOSIS — Z9189 Other specified personal risk factors, not elsewhere classified: Secondary | ICD-10-CM

## 2019-04-11 DIAGNOSIS — E663 Overweight: Secondary | ICD-10-CM

## 2019-04-11 MED ORDER — PHENTERMINE HCL 37.5 MG PO TABS: 37.5000 mg | ORAL_TABLET | Freq: Every day | ORAL | 0 refills | Status: DC

## 2019-04-11 NOTE — Progress Notes (Signed)
Phone call: Request to restart Phentermine Weight today 168 at home Tolerated in past Plan f/u weight check and BP check one month  Annamarie Major, MD, [facility], [facility] 04/11/2019  2:33 PM

## 2019-04-13 ENCOUNTER — Encounter: Payer: Self-pay | Admitting: Obstetrics & Gynecology

## 2019-04-13 ENCOUNTER — Other Ambulatory Visit: Payer: Managed Care, Other (non HMO)

## 2019-04-26 ENCOUNTER — Other Ambulatory Visit: Payer: Self-pay

## 2019-04-26 ENCOUNTER — Ambulatory Visit
Admission: EM | Admit: 2019-04-26 | Discharge: 2019-04-26 | Disposition: A | Discharge status: Home / Self Care | Payer: Managed Care, Other (non HMO) | Attending: Emergency Medicine | Admitting: Emergency Medicine

## 2019-04-26 ENCOUNTER — Encounter: Payer: Self-pay | Admitting: Emergency Medicine

## 2019-04-26 DIAGNOSIS — H6121 Impacted cerumen, right ear: Secondary | ICD-10-CM | POA: Diagnosis not present

## 2019-04-26 NOTE — ED Triage Notes (Signed)
Patient states that she has wax impaction in her right ear that started over the weekend.  Patient c/o pain in her right ear.

## 2019-04-26 NOTE — ED Provider Notes (Signed)
[facility] ____________________________________________  Time seen: Approximately 8:53 AM  I have reviewed the triage vital signs and the nursing notes.   HISTORY  Chief Complaint Cerumen Impaction   HPI Jamie Higgins is a 48 y.o. female presenting for evaluation of possible earwax buildup.  Patient reports this is a recurrent issue for her.  States over the weekend she started to feel like she was having wax buildup in her right ear and now she has muffled hearing and some tenderness.  Has started using over-the-counter Debrox.  Denies cough, congestion, nasal drainage.  No injury.  No recent fever.  Reports otherwise doing well denies other complaints.    Past Medical History:  Diagnosis Date  . Family history of breast cancer in female    genetic tesing letter sent 6/16, 8/18  . Low iron   . Wears contact lenses     Patient Active Problem List   Diagnosis Date Noted  . Overweight (BMI 25.0-29.9) 03/17/2018  . History of cervical dysplasia 01/21/2018  . Obesity (BMI 30-39.9) 06/14/2017  . Low grade squamous intraepith lesion on cytologic smear cervix (lgsil) 06/14/2017    Past Surgical History:  Procedure Laterality Date  . CESAREAN SECTION    . TONSILLECTOMY    . TUBAL LIGATION    . WISDOM TOOTH EXTRACTION       No current facility-administered medications for this encounter.   Current Outpatient Medications:  .  phentermine (ADIPEX-P) 37.5 MG tablet, Take 1 tablet (37.5 mg total) by mouth daily before breakfast., Disp: 30 tablet, Rfl: 0  Allergies Patient has no known allergies.  Family History  Problem Relation Age of Onset  . Breast cancer Father 14    Social History Social History   Tobacco Use  . Smoking status: Never Smoker  . Smokeless tobacco: Never Used  Substance Use Topics  . Alcohol use: Yes    Alcohol/week: 6.0 standard drinks    Types: 6 Cans of beer per week  . Drug use: No    Review of Systems Constitutional: No  fever ENT: No sore throat. As above. Cardiovascular: Denies chest pain. Respiratory: Denies shortness of breath. Gastrointestinal: No abdominal pain.  Skin: Negative for rash.   ____________________________________________   PHYSICAL EXAM:  VITAL SIGNS: ED Triage Vitals  Enc Vitals Group     BP 04/26/19 0840 126/83     Pulse Rate 04/26/19 0840 80     Resp 04/26/19 0840 14     Temp 04/26/19 0840 98.2 F (36.8 C)     Temp Source 04/26/19 0840 Oral     SpO2 04/26/19 0840 100 %     Weight 04/26/19 0837 168 lb (76.2 kg)     Height 04/26/19 0837 5\' 3"  (1.6 m)     Head Circumference --      Peak Flow --      Pain Score 04/26/19 0837 5     Pain Loc --      Pain Edu? --      Excl. in GC? --     Constitutional: Alert and oriented. Well appearing and in no acute distress. Eyes: Conjunctivae are normal. PERRL. EOMI. Head: Atraumatic. No sinus tenderness to palpation. No swelling. No erythema.  Ears:   Left: Nontender, mild cerumen in canal, no erythema, normal TM  Right: Nontender, total cerumen impaction, post cerumen removal normal canal, no erythema, normal TM.  Nose:No nasal congestion. Neck: No stridor.  No cervical spine tenderness to palpation. Hematological/Lymphatic/Immunilogical: No cervical lymphadenopathy.  Cardiovascular: Normal rate, regular rhythm. Grossly normal heart sounds.  Good peripheral circulation. Respiratory: Normal respiratory effort.  No retractions. No wheezes, rales or rhonchi. Good air movement.  Musculoskeletal: Ambulatory with steady gait. Neurologic:  Normal speech and language. No gait instability. Skin:  Skin appears warm, dry and intact. No rash noted. Psychiatric: Mood and affect are normal. Speech and behavior are normal.  ___________________________________________   LABS (all labs ordered are listed, but only abnormal results are displayed)  Labs Reviewed - No data to display  PROCEDURES Procedures   Cerumen removal Right ear  total cerumen impaction.  Removed by irrigation by RN.  Patient tolerated well.  INITIAL IMPRESSION / ASSESSMENT AND PLAN / ED COURSE  Pertinent labs & imaging results that were available during my care of the patient were reviewed by me and considered in my medical decision making (see chart for details).  Presented for cerumen impaction.  Cerumen removed.  Patient tolerated well.  Encourage supportive care.  Discussed follow up with Primary care physician this week. Discussed follow up and return parameters including no resolution or any worsening concerns. Patient verbalized understanding and agreed to plan.   ____________________________________________   FINAL CLINICAL IMPRESSION(S) / ED DIAGNOSES  Final diagnoses:  Impacted cerumen of right ear     ED Discharge Orders    None       Note: This dictation was prepared with Dragon dictation along with smaller phrase technology. Any transcriptional errors that result from this process are unintentional.         Renford DillsMiller, Terilyn Sano, NP 04/26/19 [phone]

## 2019-07-05 ENCOUNTER — Encounter: Payer: Self-pay | Admitting: Obstetrics & Gynecology

## 2019-07-06 ENCOUNTER — Ambulatory Visit: Payer: Managed Care, Other (non HMO) | Admitting: Obstetrics & Gynecology

## 2019-07-14 ENCOUNTER — Other Ambulatory Visit: Payer: Self-pay

## 2019-07-14 ENCOUNTER — Encounter: Payer: Self-pay | Admitting: Obstetrics & Gynecology

## 2019-07-14 ENCOUNTER — Other Ambulatory Visit (HOSPITAL_COMMUNITY)
Admission: RE | Admit: 2019-07-14 | Discharge: 2019-07-14 | Disposition: A | Discharge status: Home / Self Care | Payer: Managed Care, Other (non HMO) | Source: Ambulatory Visit | Attending: Obstetrics & Gynecology | Admitting: Obstetrics & Gynecology

## 2019-07-14 ENCOUNTER — Ambulatory Visit (INDEPENDENT_AMBULATORY_CARE_PROVIDER_SITE_OTHER): Payer: Managed Care, Other (non HMO) | Admitting: Obstetrics & Gynecology

## 2019-07-14 VITALS — BP 120/80 | Ht 63.0 in | Wt 178.0 lb

## 2019-07-14 DIAGNOSIS — Z87410 Personal history of cervical dysplasia: Secondary | ICD-10-CM | POA: Insufficient documentation

## 2019-07-14 NOTE — Progress Notes (Signed)
  HPI:  Patient is a 48 y.o. [id number] presenting for follow up evaluation of abnormal PAP smear in the past.  Her last PAP was 6 months ago and was LGSIL; and prior to that was approximate date 12/2017 and was normal,  and prior to that 2018 ASCUS. She has had a prior colposcopy then. Prior biopsies (if done) were Normal.  No prior LEEP.  PMHx: She  has a past medical history of Family history of breast cancer in female, Low iron, and Wears contact lenses. Also,  has a past surgical history that includes Cesarean section; Tubal ligation; Tonsillectomy; and Wisdom tooth extraction., family history includes Breast cancer (age of onset: 45) in her father.,  reports that she has never smoked. She has never used smokeless tobacco. She reports current alcohol use of about 6.0 standard drinks of alcohol per week. She reports that she does not use drugs.  She currently has no medications in their medication list. Also, has No Known Allergies.  Review of Systems  All other systems reviewed and are negative.   Objective: BP 120/80   Ht 5\' 3"  (1.6 m)   Wt 178 lb (80.7 kg)   BMI 31.53 kg/m  Filed Weights   07/14/19 1540  Weight: 178 lb (80.7 kg)   Body mass index is 31.53 kg/m.  Physical examination Physical Exam Constitutional:      General: She is not in acute distress.    Appearance: She is well-developed.  Genitourinary:     Pelvic exam was performed with patient supine.     Vagina and uterus normal.     No vaginal erythema or bleeding.     No cervical motion tenderness, discharge, polyp or nabothian cyst.     Uterus is mobile.     Uterus is not enlarged.     No uterine mass detected.    Uterus is midaxial.     No right or left adnexal mass present.     Right adnexa not tender.     Left adnexa not tender.  HENT:     Head: Normocephalic and atraumatic.     Nose: Nose normal.  Abdominal:     General: There is no distension.     Palpations: Abdomen is soft.     Tenderness: There is no  abdominal tenderness.  Musculoskeletal: Normal range of motion.  Neurological:     Mental Status: She is alert and oriented to person, place, and time.     Cranial Nerves: No cranial nerve deficit.  Skin:    General: Skin is warm and dry.     ASSESSMENT:  History of Cervical Dysplasia  Plan:  1.  I discussed the grading system of pap smears and HPV high risk viral types.   2. Follow up PAP 6 months, vs intervention if high grade dysplasia identified. 3. Treatment of persistantly abnormal PAP smears and cervical dysplasia, even mild, is discussed w pt today in detail, as well as the pros and cons of Cryo and LEEP procedures. Will consider and discuss after results.   Barnett Applebaum, MD, [facility], [facility] 07/14/2019  4:03 PM

## 2019-07-18 LAB — CYTOLOGY - PAP: Diagnosis: UNDETERMINED — AB

## 2019-09-11 ENCOUNTER — Ambulatory Visit
Admission: RE | Admit: 2019-09-11 | Discharge: 2019-09-11 | Disposition: A | Discharge status: Home / Self Care | Payer: Managed Care, Other (non HMO) | Source: Ambulatory Visit | Attending: Obstetrics & Gynecology | Admitting: Obstetrics & Gynecology

## 2019-09-11 ENCOUNTER — Other Ambulatory Visit: Payer: Self-pay

## 2019-09-11 DIAGNOSIS — Z1239 Encounter for other screening for malignant neoplasm of breast: Secondary | ICD-10-CM | POA: Diagnosis present

## 2019-09-11 DIAGNOSIS — Z1231 Encounter for screening mammogram for malignant neoplasm of breast: Secondary | ICD-10-CM | POA: Insufficient documentation

## 2019-10-02 ENCOUNTER — Other Ambulatory Visit: Payer: Self-pay | Admitting: Obstetrics & Gynecology

## 2019-10-02 ENCOUNTER — Encounter: Payer: Self-pay | Admitting: Obstetrics & Gynecology

## 2019-10-02 MED ORDER — PHENTERMINE HCL 37.5 MG PO TABS: ORAL_TABLET | ORAL | 0 refills | Status: DC

## 2020-01-25 ENCOUNTER — Other Ambulatory Visit (HOSPITAL_COMMUNITY)
Admission: RE | Admit: 2020-01-25 | Discharge: 2020-01-25 | Disposition: A | Discharge status: Home / Self Care | Payer: Managed Care, Other (non HMO) | Source: Ambulatory Visit | Attending: Obstetrics & Gynecology | Admitting: Obstetrics & Gynecology

## 2020-01-25 ENCOUNTER — Other Ambulatory Visit: Payer: Self-pay

## 2020-01-25 ENCOUNTER — Ambulatory Visit (INDEPENDENT_AMBULATORY_CARE_PROVIDER_SITE_OTHER): Payer: Managed Care, Other (non HMO) | Admitting: Obstetrics & Gynecology

## 2020-01-25 ENCOUNTER — Encounter: Payer: Self-pay | Admitting: Obstetrics & Gynecology

## 2020-01-25 VITALS — BP 120/70 | HR 80 | Ht 63.0 in | Wt 176.0 lb

## 2020-01-25 DIAGNOSIS — E669 Obesity, unspecified: Secondary | ICD-10-CM

## 2020-01-25 DIAGNOSIS — Z87410 Personal history of cervical dysplasia: Secondary | ICD-10-CM | POA: Diagnosis not present

## 2020-01-25 DIAGNOSIS — Z01419 Encounter for gynecological examination (general) (routine) without abnormal findings: Secondary | ICD-10-CM | POA: Diagnosis not present

## 2020-01-25 DIAGNOSIS — Z1211 Encounter for screening for malignant neoplasm of colon: Secondary | ICD-10-CM

## 2020-01-25 DIAGNOSIS — Z1231 Encounter for screening mammogram for malignant neoplasm of breast: Secondary | ICD-10-CM

## 2020-01-25 MED ORDER — PHENTERMINE HCL 37.5 MG PO TABS: ORAL_TABLET | ORAL | 1 refills | Status: DC

## 2020-01-25 NOTE — Patient Instructions (Signed)
PAP every 6 mos to one year Mammogram every year    Call [phone] to schedule at [facility] Colonoscopy every 10 years Labs yearly (with PCP)

## 2020-01-25 NOTE — Progress Notes (Signed)
   HPI:      [name] is a 49 y.o. [id number] who LMP was Patient's last menstrual period was 12/17/2019., she presents today for her annual examination. The patient has no complaints today. The patient is sexually active. Her last mammogram: approximate date 08/2019 and was normal. The patient does perform self breast exams.  There is no notable family history of breast or ovarian cancer in her family.  The patient has regular exercise: yes.  The patient denies current symptoms of depression.  Periods q o month.  PAP hx 2014-2017 LGSIL 2016 Colpo Bx normal 2018 ASCUS 2019 nml 2020 LGSIL, then ASCUS in Aug 2020  GYN History: Contraception: tubal ligation  PMHx: Past Medical History:  Diagnosis Date  . Family history of breast cancer in female    genetic tesing letter sent 6/16, 8/18  . Low iron   . Wears contact lenses    Past Surgical History:  Procedure Laterality Date  . CESAREAN SECTION    . TONSILLECTOMY    . TUBAL LIGATION    . WISDOM TOOTH EXTRACTION     Family History  Problem Relation Age of Onset  . Breast cancer Father 42   Social History   Tobacco Use  . Smoking status: Never Smoker  . Smokeless tobacco: Never Used  Substance Use Topics  . Alcohol use: Yes    Alcohol/week: 6.0 standard drinks    Types: 6 Cans of beer per week  . Drug use: No    Current Outpatient Medications:  .  phentermine (ADIPEX-P) 37.5 MG tablet, One tablet po in morning., Disp: 30 tablet, Rfl: 1 Allergies: Patient has no known allergies.  ROS  Objective: BP 120/70 (BP Location: Right Arm, Patient Position: Sitting, Cuff Size: Normal)   Pulse 80   Ht 5\' 3"  (1.6 m)   Wt 176 lb (79.8 kg)   LMP 12/17/2019   BMI 31.18 kg/m   Filed Weights   01/25/20 1532  Weight: 176 lb (79.8 kg)   Body mass index is 31.18 kg/m. OBGyn Exam  Assessment:  ANNUAL EXAM 1. History of cervical dysplasia   2. Women's annual routine gynecological examination   3. Obesity (BMI 30-39.9)     4. Encounter for screening mammogram for malignant neoplasm of breast   5. Screen for colon cancer      Screening Plan:            1.  Cervical Screening-  Pap smear done today  2. Breast screening- Exam annually and mammogram>40 planned, due in Oct 2021  3. Colonoscopy every 10 years, Hemoccult testing - after age 53.  Referral [address].  4. Labs managed by PCP (Husband works for [employer], they have labs yearly thru work)  5. Counseling for contraception: bilateral tubal ligation   6. History of cervical dysplasia - Cytology - PAP   7. Obesity (BMI 30-39.9) - Phentermine pros and cons discussed, 2 mos then off    F/U  Return in about 6 months (around 07/24/2020) for Follow up.  07/26/2020, MD, Annamarie Major Ob/Gyn, [facility] 01/25/2020  4:10 PM

## 2020-01-29 LAB — CYTOLOGY - PAP: Diagnosis: NEGATIVE

## 2020-02-07 ENCOUNTER — Encounter: Payer: Self-pay | Admitting: Obstetrics and Gynecology

## 2020-02-22 NOTE — Telephone Encounter (Signed)
Sch F/U PAP visit in Sept

## 2020-02-22 NOTE — Telephone Encounter (Signed)
Patient is schedule 07/31/20 with [facility]

## 2020-07-21 ENCOUNTER — Other Ambulatory Visit: Payer: Self-pay | Admitting: Obstetrics & Gynecology

## 2020-07-21 MED ORDER — PHENTERMINE HCL 37.5 MG PO TABS: ORAL_TABLET | ORAL | 0 refills | Status: DC

## 2020-07-30 NOTE — Telephone Encounter (Signed)
Patient is rescheduled to 09/04/20 with [facility]

## 2020-07-30 NOTE — Telephone Encounter (Signed)
Can you reschedule pt

## 2020-07-31 ENCOUNTER — Ambulatory Visit: Payer: Managed Care, Other (non HMO) | Admitting: Obstetrics & Gynecology

## 2020-09-04 ENCOUNTER — Ambulatory Visit: Payer: Managed Care, Other (non HMO) | Admitting: Obstetrics & Gynecology

## 2020-09-17 ENCOUNTER — Telehealth (INDEPENDENT_AMBULATORY_CARE_PROVIDER_SITE_OTHER): Payer: Self-pay | Admitting: Gastroenterology

## 2020-09-17 ENCOUNTER — Other Ambulatory Visit: Payer: Self-pay

## 2020-09-17 DIAGNOSIS — Z1211 Encounter for screening for malignant neoplasm of colon: Secondary | ICD-10-CM

## 2020-09-17 MED ORDER — NA SULFATE-K SULFATE-MG SULF 17.5-3.13-1.6 GM/177ML PO SOLN: 1.0000 | Freq: Once | ORAL | 0 refills | Status: AC

## 2020-09-17 NOTE — Progress Notes (Signed)
Gastroenterology Pre-Procedure Review  Request Date: 10/01/20 Requesting Physician: Dr. Bonna Gains  PATIENT REVIEW QUESTIONS: The patient responded to the following health history questions as indicated:    1. Are you having any GI issues? no 2. Do you have a personal history of Polyps? no 3. Do you have a family history of Colon Cancer or Polyps? no 4. Diabetes Mellitus? no 5. Joint replacements in the past 12 months?no 6. Major health problems in the past 3 months?no 7. Any artificial heart valves, MVP, or defibrillator?no    MEDICATIONS & ALLERGIES:    Patient reports the following regarding taking any anticoagulation/antiplatelet therapy:   Plavix, Coumadin, Eliquis, Xarelto, Lovenox, Pradaxa, Brilinta, or Effient? no Aspirin? no  Patient confirms/reports the following medications:  Current Outpatient Medications  Medication Sig Dispense Refill  . Na Sulfate-K Sulfate-Mg Sulf 17.5-3.13-1.6 GM/177ML SOLN Take 1 kit by mouth once for 1 dose. 354 mL 0   No current facility-administered medications for this visit.    Patient confirms/reports the following allergies:  No Known Allergies  Orders Placed This Encounter  Procedures  . Procedural/ Surgical Case Request: COLONOSCOPY WITH PROPOFOL    Standing Status:   Standing    Number of Occurrences:   1    Order Specific Question:   Pre-op diagnosis    Answer:   screening colonoscopy    Order Specific Question:   CPT Code    Answer:   16109    AUTHORIZATION INFORMATION Primary Insurance: 1D#: Group #:  Secondary Insurance: 1D#: Group #:  SCHEDULE INFORMATION: Date: 10/01/20 Time: Location:[facility]

## 2020-09-20 ENCOUNTER — Ambulatory Visit (INDEPENDENT_AMBULATORY_CARE_PROVIDER_SITE_OTHER): Payer: Managed Care, Other (non HMO) | Admitting: Obstetrics & Gynecology

## 2020-09-20 ENCOUNTER — Other Ambulatory Visit: Payer: Self-pay

## 2020-09-20 ENCOUNTER — Encounter: Payer: Self-pay | Admitting: Obstetrics & Gynecology

## 2020-09-20 ENCOUNTER — Other Ambulatory Visit (HOSPITAL_COMMUNITY)
Admission: RE | Admit: 2020-09-20 | Discharge: 2020-09-20 | Disposition: A | Discharge status: Home / Self Care | Payer: Managed Care, Other (non HMO) | Source: Ambulatory Visit | Attending: Obstetrics & Gynecology | Admitting: Obstetrics & Gynecology

## 2020-09-20 VITALS — BP 120/80 | Ht 63.0 in | Wt 186.0 lb

## 2020-09-20 DIAGNOSIS — Z87410 Personal history of cervical dysplasia: Secondary | ICD-10-CM | POA: Diagnosis not present

## 2020-09-20 NOTE — Progress Notes (Signed)
HPI:  Patient is a 49 y.o. [id number] presenting for follow up evaluation of abnormal PAP smear in the past.  Her last PAP was 8 months ago and was normal and she has 7 year h/o intermittent normal and LGSIL and ASCUS PAPs.  . She has had a prior colposcopy. Prior biopsies (if done) were normal.  Periods are irreg as she nears menopause.  She is frustrated w periods and uncertainty of her cervical dysplasia.  PMHx: She  has a past medical history of Family history of breast cancer in female, Low iron, and Wears contact lenses. Also,  has a past surgical history that includes Cesarean section; Tubal ligation; Tonsillectomy; and Wisdom tooth extraction., family history includes Breast cancer (age of onset: 88) in her father.,  reports that she has never smoked. She has never used smokeless tobacco. She reports current alcohol use of about 6.0 standard drinks of alcohol per week. She reports that she does not use drugs.  She currently has no medications in their medication list. Also, has No Known Allergies.  Review of Systems  All other systems reviewed and are negative.   Objective: BP 120/80   Ht 5\' 3"  (1.6 m)   Wt 186 lb (84.4 kg)   LMP 07/10/2020   BMI 32.95 kg/m  Filed Weights   09/20/20 1555  Weight: 186 lb (84.4 kg)   Body mass index is 32.95 kg/m.  Physical examination Physical Exam Constitutional:      General: She is not in acute distress.    Appearance: She is well-developed.  Genitourinary:     Pelvic exam was performed with patient supine.     Vagina and uterus normal.     No vaginal erythema or bleeding.     No cervical motion tenderness, discharge, polyp or nabothian cyst.     Uterus is mobile.     Uterus is not enlarged.     No uterine mass detected.    Uterus is midaxial.     No right or left adnexal mass present.     Right adnexa not tender.     Left adnexa not tender.  HENT:     Head: Normocephalic and atraumatic.     Nose: Nose normal.  Abdominal:      General: There is no distension.     Palpations: Abdomen is soft.     Tenderness: There is no abdominal tenderness.  Musculoskeletal:        General: Normal range of motion.  Neurological:     Mental Status: She is alert and oriented to person, place, and time.     Cranial Nerves: No cranial nerve deficit.  Skin:    General: Skin is warm and dry.  Psychiatric:        Attention and Perception: Attention normal.        Mood and Affect: Mood and affect normal.        Speech: Speech normal.        Behavior: Behavior normal.        Thought Content: Thought content normal.        Judgment: Judgment normal.     ASSESSMENT:  History of Cervical Dysplasia  Plan:  1.  I discussed the grading system of pap smears and HPV high risk viral types.   2. Follow up PAP 6 months, vs intervention if high grade dysplasia identified. 3. Treatment of persistantly abnormal PAP smears and cervical dysplasia, even mild, is discussed w pt today in detail, as  well as the pros and cons of Cryo and LEEP procedures. Will consider and discuss after results.  Also to consider hysterectomy as pt desires this if PAP cont to be abnormal, in addition to her period irregularity frustrations.  A total of 20 minutes were spent face-to-face with the patient as well as preparation, review, communication, and documentation during this encounter.    Annamarie Major, MD, [facility], [facility] 09/20/2020  4:38 PM

## 2020-09-24 LAB — CYTOLOGY - PAP: Diagnosis: NEGATIVE

## 2020-09-27 ENCOUNTER — Other Ambulatory Visit: Payer: Self-pay

## 2020-09-27 ENCOUNTER — Other Ambulatory Visit
Admission: RE | Admit: 2020-09-27 | Discharge: 2020-09-27 | Disposition: A | Discharge status: Home / Self Care | Payer: Managed Care, Other (non HMO) | Source: Ambulatory Visit | Attending: Gastroenterology | Admitting: Gastroenterology

## 2020-09-27 DIAGNOSIS — Z01812 Encounter for preprocedural laboratory examination: Secondary | ICD-10-CM | POA: Insufficient documentation

## 2020-09-27 DIAGNOSIS — Z20822 Contact with and (suspected) exposure to covid-19: Secondary | ICD-10-CM | POA: Insufficient documentation

## 2020-09-27 LAB — SARS CORONAVIRUS 2 (TAT 6-24 HRS): SARS Coronavirus 2: NEGATIVE

## 2020-10-01 ENCOUNTER — Encounter
Admission: RE | Disposition: A | Discharge status: Home / Self Care | Payer: Self-pay | Source: Home / Self Care | Attending: Gastroenterology

## 2020-10-01 ENCOUNTER — Ambulatory Visit: Payer: Managed Care, Other (non HMO) | Admitting: Certified Registered Nurse Anesthetist

## 2020-10-01 ENCOUNTER — Ambulatory Visit
Admission: RE | Admit: 2020-10-01 | Discharge: 2020-10-01 | Disposition: A | Discharge status: Home / Self Care | Payer: Managed Care, Other (non HMO) | Attending: Gastroenterology | Admitting: Gastroenterology

## 2020-10-01 ENCOUNTER — Other Ambulatory Visit: Payer: Self-pay

## 2020-10-01 ENCOUNTER — Encounter: Payer: Self-pay | Admitting: Gastroenterology

## 2020-10-01 DIAGNOSIS — Z803 Family history of malignant neoplasm of breast: Secondary | ICD-10-CM | POA: Insufficient documentation

## 2020-10-01 DIAGNOSIS — Z1211 Encounter for screening for malignant neoplasm of colon: Secondary | ICD-10-CM | POA: Diagnosis present

## 2020-10-01 DIAGNOSIS — Z0000 Encounter for general adult medical examination without abnormal findings: Secondary | ICD-10-CM

## 2020-10-01 HISTORY — PX: COLONOSCOPY WITH PROPOFOL: SHX5780

## 2020-10-01 SURGERY — COLONOSCOPY WITH PROPOFOL
Anesthesia: General

## 2020-10-01 MED ORDER — PROPOFOL 500 MG/50ML IV EMUL
INTRAVENOUS | Status: DC | PRN
  Administered 2020-10-01: 160 ug/kg/min via INTRAVENOUS

## 2020-10-01 MED ORDER — SODIUM CHLORIDE 0.9 % IV SOLN
INTRAVENOUS | Status: DC
  Administered 2020-10-01: 1000 mL via INTRAVENOUS

## 2020-10-01 MED ORDER — PROPOFOL 10 MG/ML IV BOLUS
INTRAVENOUS | Status: DC | PRN
  Administered 2020-10-01: 70 mg via INTRAVENOUS

## 2020-10-01 NOTE — Op Note (Signed)
[facility] Gastroenterology Patient Name: Jamie Higgins Procedure Date: 10/01/2020 10:35 AM MRN: [id number] Account #: [id number] Date of Birth: 05-Aug-1971 Admit Type: Outpatient Age: 49 Room: [facility] ENDO ROOM 3 Gender: Female Note Status: Finalized Procedure:             Colonoscopy Indications:           Screening for colorectal malignant neoplasm Providers:             Joie Hipps B. Maximino Greenland MD, MD Referring MD:          Nadara Mustard, MD (Referring MD) Medicines:             Monitored Anesthesia Care Complications:         No immediate complications. Procedure:             Pre-Anesthesia Assessment:                        - Prior to the procedure, a History and Physical was                         performed, and patient medications, allergies and                         sensitivities were reviewed. The patient's tolerance                         of previous anesthesia was reviewed.                        - The risks and benefits of the procedure and the                         sedation options and risks were discussed with the                         patient. All questions were answered and informed                         consent was obtained.                        - Patient identification and proposed procedure were                         verified prior to the procedure by the physician, the                         nurse, the anesthetist and the technician. The                         procedure was verified in the pre-procedure area in                         the procedure room in the endoscopy suite.                        - ASA Grade Assessment: II - A patient with mild  systemic disease.                        - After reviewing the risks and benefits, the patient                         was deemed in satisfactory condition to undergo the                         procedure.                        After obtaining informed consent,  the colonoscope was                         passed under direct vision. Throughout the procedure,                         the patient's blood pressure, pulse, and oxygen                         saturations were monitored continuously. The                         Colonoscope was introduced through the anus and                         advanced to the the cecum, identified by appendiceal                         orifice and ileocecal valve. The colonoscopy was                         performed with ease. The patient tolerated the                         procedure well. The quality of the bowel preparation                         was good. Findings:      The perianal and digital rectal examinations were normal.      The rectum, sigmoid colon, descending colon, transverse colon, ascending       colon and cecum appeared normal.      The retroflexed view of the distal rectum and anal verge was normal and       showed no anal or rectal abnormalities. Impression:            - The rectum, sigmoid colon, descending colon,                         transverse colon, ascending colon and cecum are normal.                        - The distal rectum and anal verge are normal on                         retroflexion view.                        - No  specimens collected. Recommendation:        - Discharge patient to home.                        - Resume previous diet.                        - Continue present medications.                        - Repeat colonoscopy in 10 years for screening                         purposes.                        - Return to primary care physician as previously                         scheduled.                        - The findings and recommendations were discussed with                         the patient.                        - The findings and recommendations were discussed with                         the patient's family.                        - In the future, if  patient develops new symptoms such                         as blood per rectum, abdominal pain, weight loss,                         altered bowel habits or any other reason for concern,                         patient should discuss this with thier PCP as they may                         need a GI referral at that time or evaluation for need                         for colonoscopy earlier than the recommended screening                         colonoscopy.                        In addition, if patient's family history of colon                         cancer changes (no family history at this time) in the  future, earlier screening may be indicated and patient                         should discuss this with PCP as well. Procedure Code(s):     --- Professional ---                        [phone], Colonoscopy, flexible; diagnostic, including                         collection of specimen(s) by brushing or washing, when                         performed (separate procedure) Diagnosis Code(s):     --- Professional ---                        Z12.11, Encounter for screening for malignant neoplasm                         of colon CPT copyright 2019 American Medical Association. All rights reserved. The codes documented in this report are preliminary and upon coder review may  be revised to meet current compliance requirements.  Melodie Bouillon, MD Michel Bickers B. Maximino Greenland MD, MD 10/01/2020 10:59:06 AM This report has been signed electronically. Number of Addenda: 0 Note Initiated On: 10/01/2020 10:35 AM Scope Withdrawal Time: 0 hours 9 minutes 13 seconds  Total Procedure Duration: 0 hours 12 minutes 28 seconds       [facility]

## 2020-10-01 NOTE — H&P (Signed)
Jamie Bouillon, MD [address], [address], [place], [place], [address] [address], [place], [place], [address] Phone: [phone]  Fax: [phone]  Primary Care Physician:  Nadara Mustard, MD   Pre-Procedure History & Physical: HPI:  Jamie Higgins is a 49 y.o. female is here for a colonoscopy.   Past Medical History:  Diagnosis Date  . Family history of breast cancer in female    genetic tesing letter sent 6/16, 8/18, 3/21  . Low iron   . Wears contact lenses     Past Surgical History:  Procedure Laterality Date  . CESAREAN SECTION    . TONSILLECTOMY    . TUBAL LIGATION    . WISDOM TOOTH EXTRACTION      Prior to Admission medications   Not on File    Allergies as of 09/17/2020  . (No Known Allergies)    Family History  Problem Relation Age of Onset  . Breast cancer Father 64    Social History   Socioeconomic History  . Marital status: Married    Spouse name: Not on file  . Number of children: Not on file  . Years of education: Not on file  . Highest education level: Not on file  Occupational History  . Not on file  Tobacco Use  . Smoking status: Never Smoker  . Smokeless tobacco: Never Used  Vaping Use  . Vaping Use: Never used  Substance and Sexual Activity  . Alcohol use: Yes    Alcohol/week: 6.0 standard drinks    Types: 6 Cans of beer per week  . Drug use: No  . Sexual activity: Yes    Birth control/protection: None  Other Topics Concern  . Not on file  Social History Narrative  . Not on file   Social Determinants of Health   Financial Resource Strain:   . Difficulty of Paying Living Expenses: Not on file  Food Insecurity:   . Worried About Programme researcher, broadcasting/film/video in the Last Year: Not on file  . Ran Out of Food in the Last Year: Not on file  Transportation Needs:   . Lack of Transportation (Medical): Not on file  . Lack of Transportation (Non-Medical): Not on file  Physical Activity:   . Days of Exercise per Week:  Not on file  . Minutes of Exercise per Session: Not on file  Stress:   . Feeling of Stress : Not on file  Social Connections:   . Frequency of Communication with Friends and Family: Not on file  . Frequency of Social Gatherings with Friends and Family: Not on file  . Attends Religious Services: Not on file  . Active Member of Clubs or Organizations: Not on file  . Attends Banker Meetings: Not on file  . Marital Status: Not on file  Intimate Partner Violence:   . Fear of Current or Ex-Partner: Not on file  . Emotionally Abused: Not on file  . Physically Abused: Not on file  . Sexually Abused: Not on file    Review of Systems: See HPI, otherwise negative ROS  Physical Exam:  General:   Alert,  pleasant and cooperative in NAD Head:  Normocephalic and atraumatic. Neck:  Supple; no masses or thyromegaly. Lungs:  Clear throughout to auscultation, normal respiratory effort.    Heart:  +S1, +S2, Regular rate and rhythm, No edema. Abdomen:  Soft, nontender and nondistended. Normal bowel sounds, without guarding, and without rebound.   Neurologic:  Alert and  oriented x4;  grossly normal neurologically.  Impression/Plan: Jamie Higgins is here for a colonoscopy to be performed for average risk screening.  Risks, benefits, limitations, and alternatives regarding  colonoscopy have been reviewed with the patient.  Questions have been answered.  All parties agreeable.   Pasty Spillers, MD  10/01/2020, 10:21 AM

## 2020-10-01 NOTE — Transfer of Care (Signed)
Immediate Anesthesia Transfer of Care Note  Patient: Jamie Higgins   Procedure(s) Performed: COLONOSCOPY WITH PROPOFOL (N/A )  Patient Location: PACU  Anesthesia Type:General  Level of Consciousness: awake, alert  and oriented  Airway & Oxygen Therapy: Patient Spontanous Breathing  Post-op Assessment: Report given to RN and Post -op Vital signs reviewed and stable  Post vital signs: Reviewed and stable  Last Vitals:  Vitals Value Taken Time  BP 115/67 10/01/20 1058  Temp 36.3 C 10/01/20 1058  Pulse 72 10/01/20 1058  Resp 25 10/01/20 1058  SpO2 97 % 10/01/20 1058  Vitals shown include unvalidated device data.  Last Pain:  Vitals:   10/01/20 1058  TempSrc:   PainSc: 0-No pain         Complications: No complications documented.

## 2020-10-01 NOTE — Anesthesia Preprocedure Evaluation (Signed)
Anesthesia Evaluation  Patient identified by MRN, date of birth, ID band Patient awake    Reviewed: Allergy & Precautions, NPO status , Patient's Chart, lab work & pertinent test results  History of Anesthesia Complications Negative for: history of anesthetic complications  Airway Mallampati: II  TM Distance: >3 FB Neck ROM: Full    Dental no notable dental hx. (+) Teeth Intact   Pulmonary neg pulmonary ROS, neg sleep apnea, neg COPD, Patient abstained from smoking.Not current smoker,    Pulmonary exam normal breath sounds clear to auscultation       Cardiovascular Exercise Tolerance: Good METS(-) hypertension(-) CAD and (-) Past MI negative cardio ROS  (-) dysrhythmias  Rhythm:Regular Rate:Normal - Systolic murmurs    Neuro/Psych negative neurological ROS  negative psych ROS   GI/Hepatic neg GERD  ,(+)     (-) substance abuse  ,   Endo/Other  neg diabetes  Renal/GU negative Renal ROS     Musculoskeletal   Abdominal   Peds  Hematology   Anesthesia Other Findings Past Medical History: No date: Family history of breast cancer in female     Comment:  genetic tesing letter sent 6/16, 8/18, 3/21 No date: Low iron No date: Wears contact lenses  Reproductive/Obstetrics                             Anesthesia Physical Anesthesia Plan  ASA: I  Anesthesia Plan: General   Post-op Pain Management:    Induction: Intravenous  PONV Risk Score and Plan: 3 and Ondansetron, Propofol infusion and TIVA  Airway Management Planned: Nasal Cannula  Additional Equipment: None  Intra-op Plan:   Post-operative Plan:   Informed Consent: I have reviewed the patients History and Physical, chart, labs and discussed the procedure including the risks, benefits and alternatives for the proposed anesthesia with the patient or authorized representative who has indicated his/her understanding and acceptance.      Dental advisory given  Plan Discussed with: CRNA and Surgeon  Anesthesia Plan Comments: (Discussed risks of anesthesia with patient, including possibility of difficulty with spontaneous ventilation under anesthesia necessitating airway intervention, PONV, and rare risks such as cardiac or respiratory or neurological events. Patient understands.)        Anesthesia Quick Evaluation

## 2020-10-01 NOTE — Anesthesia Procedure Notes (Signed)
Performed by: Malva Cogan, CRNA Pre-anesthesia Checklist: Emergency Drugs available, Patient identified, Suction available, Patient being monitored and Timeout performed Patient Re-evaluated:Patient Re-evaluated prior to induction Oxygen Delivery Method: Nasal cannula Induction Type: IV induction Placement Confirmation: CO2 detector

## 2020-10-01 NOTE — Anesthesia Postprocedure Evaluation (Signed)
Anesthesia Post Note  Patient: Jamie Higgins  Procedure(s) Performed: COLONOSCOPY WITH PROPOFOL (N/A )  Patient location during evaluation: Endoscopy Anesthesia Type: General Level of consciousness: awake and alert Pain management: pain level controlled Vital Signs Assessment: post-procedure vital signs reviewed and stable Respiratory status: spontaneous breathing, nonlabored ventilation, respiratory function stable and patient connected to nasal cannula oxygen Cardiovascular status: blood pressure returned to baseline and stable Postop Assessment: no apparent nausea or vomiting Anesthetic complications: no   No complications documented.   Last Vitals:  Vitals:   10/01/20 1058 10/01/20 1107  BP: 115/67 (!) 102/46  Pulse: 74 66  Resp: (!) 21 16  Temp: (!) 36.3 C   SpO2: 98% 100%    Last Pain:  Vitals:   10/01/20 1107  TempSrc:   PainSc: 0-No pain                 Corinda Gubler

## 2020-10-02 ENCOUNTER — Encounter: Payer: Self-pay | Admitting: Gastroenterology

## 2020-11-11 ENCOUNTER — Other Ambulatory Visit: Payer: Self-pay

## 2020-11-11 ENCOUNTER — Ambulatory Visit
Admission: RE | Admit: 2020-11-11 | Discharge: 2020-11-11 | Disposition: A | Discharge status: Home / Self Care | Payer: Managed Care, Other (non HMO) | Source: Ambulatory Visit | Attending: Obstetrics & Gynecology | Admitting: Obstetrics & Gynecology

## 2020-11-11 DIAGNOSIS — Z1231 Encounter for screening mammogram for malignant neoplasm of breast: Secondary | ICD-10-CM | POA: Insufficient documentation

## 2020-11-12 ENCOUNTER — Ambulatory Visit: Payer: Managed Care, Other (non HMO)

## 2021-01-15 ENCOUNTER — Other Ambulatory Visit: Payer: Self-pay

## 2021-01-15 ENCOUNTER — Ambulatory Visit
Admission: EM | Admit: 2021-01-15 | Discharge: 2021-01-15 | Disposition: A | Discharge status: Home / Self Care | Payer: Managed Care, Other (non HMO) | Attending: Emergency Medicine | Admitting: Emergency Medicine

## 2021-01-15 DIAGNOSIS — R0789 Other chest pain: Secondary | ICD-10-CM | POA: Insufficient documentation

## 2021-01-15 LAB — COMPREHENSIVE METABOLIC PANEL
ALT: 16 U/L (ref 0–44)
AST: 18 U/L (ref 15–41)
Albumin: 4.2 g/dL (ref 3.5–5.0)
Alkaline Phosphatase: 73 U/L (ref 38–126)
Anion gap: 11 (ref 5–15)
BUN: 13 mg/dL (ref 6–20)
CO2: 26 mmol/L (ref 22–32)
Calcium: 9.5 mg/dL (ref 8.9–10.3)
Chloride: 103 mmol/L (ref 98–111)
Creatinine, Ser: 0.81 mg/dL (ref 0.44–1.00)
GFR, Estimated: >60 mL/min (ref >60)
Glucose, Bld: 103 mg/dL — ABNORMAL HIGH (ref 70–99)
Potassium: 4.2 mmol/L (ref 3.5–5.1)
Sodium: 140 mmol/L (ref 135–145)
Total Bilirubin: 0.6 mg/dL (ref 0.3–1.2)
Total Protein: 7.6 g/dL (ref 6.5–8.1)

## 2021-01-15 LAB — TROPONIN I (HIGH SENSITIVITY): Troponin I (High Sensitivity): 3 ng/L (ref <18)

## 2021-01-15 NOTE — Discharge Instructions (Addendum)
Your EKG and blood work today did not reveal the presence of any cardiac disease.  I am unsure what is causing your intermittent chest pain.  I recommend making an appointment to follow-up with your primary care provider to discuss this further.

## 2021-01-15 NOTE — ED Provider Notes (Signed)
[facility]    CSN: [id number] Arrival date & time: 01/15/21  1014      History   Chief Complaint Chief Complaint  Patient presents with  . Chest Pain    HPI Jamie Higgins is a 50 y.o. female.   HPI   50 year old female here for evaluation of left-sided chest pain.  Patient reports that her pain started last night around 8 PM while she was sitting on the couch watching television.  She states that the pain will come and go.  When it comes on is 4-5 real quick episodes of a sharp stabbing pain that then resolve.  This is happened 3 times since 8 PM last night.  The second time was this morning when she was laying in bed and the third time was when she was cleaning her daughter's room.  Patient states that the pain is not constant and is not associated with shortness of breath, sweating, nausea, and is nonradiating.  Patient reports is nothing she can do to provoke or relieve the pain.  She has had no changes in vision or swelling in her extremities.  Patient reports that she did have a short episode of heart fluttering yesterday but that has not returned.  Patient is active and walks 2 miles twice daily at a 15 minute per mile pace.  She denies taking any weight loss or stimulant medication, she is a non-smoker, she is not taking any kind of hormone replacement therapy, has had no recent long car rides or airplane travel, and has no personal or family history of blood clots.    Past Medical History:  Diagnosis Date  . Family history of breast cancer in female    genetic tesing letter sent 6/16, 8/18, 3/21  . Low iron   . Wears contact lenses     Patient Active Problem List   Diagnosis Date Noted  . Encounter for screening colonoscopy   . Overweight (BMI 25.0-29.9) 03/17/2018  . History of cervical dysplasia 01/21/2018  . Obesity (BMI 30-39.9) 06/14/2017  . Low grade squamous intraepith lesion on cytologic smear cervix (lgsil) 06/14/2017    Past Surgical History:   Procedure Laterality Date  . CESAREAN SECTION    . COLONOSCOPY WITH PROPOFOL N/A 10/01/2020   Procedure: COLONOSCOPY WITH PROPOFOL;  Surgeon: Pasty Spillers, MD;  Location: [facility] ENDOSCOPY;  Service: Endoscopy;  Laterality: N/A;  . TONSILLECTOMY    . TUBAL LIGATION    . WISDOM TOOTH EXTRACTION      OB History    Gravida  5   Para  5   Term  4   Preterm  1   AB  0   Living  4     SAB      IAB  0   Ectopic      Multiple      Live Births               Home Medications    Prior to Admission medications   Not on File    Family History Family History  Problem Relation Age of Onset  . Breast cancer Father 57    Social History Social History   Tobacco Use  . Smoking status: Never Smoker  . Smokeless tobacco: Never Used  Vaping Use  . Vaping Use: Never used  Substance Use Topics  . Alcohol use: Yes    Alcohol/week: 6.0 standard drinks    Types: 6 Cans of beer per week  .  Drug use: No     Allergies   Patient has no known allergies.   Review of Systems Review of Systems  Constitutional: Negative for activity change, appetite change, chills, diaphoresis, fatigue and fever.  Respiratory: Negative for cough, shortness of breath and wheezing.   Cardiovascular: Positive for chest pain and palpitations. Negative for leg swelling.  Gastrointestinal: Negative for nausea.  Neurological: Negative for dizziness and syncope.  Hematological: Negative.   Psychiatric/Behavioral: Negative.      Physical Exam Triage Vital Signs ED Triage Vitals  Enc Vitals Group     BP 01/15/21 1046 90/69     Pulse Rate 01/15/21 1046 62     Resp 01/15/21 1046 18     Temp 01/15/21 1046 98 F (36.7 C)     Temp Source 01/15/21 1046 Oral     SpO2 01/15/21 1046 100 %     Weight 01/15/21 1044 180 lb (81.6 kg)     Height 01/15/21 1044 5\' 3"  (1.6 m)     Head Circumference --      Peak Flow --      Pain Score 01/15/21 1044 2     Pain Loc --      Pain Edu? --       Excl. in GC? --    No data found.  Updated Vital Signs BP 90/69 (BP Location: Left Arm)   Pulse 62   Temp 98 F (36.7 C) (Oral)   Resp 18   Ht 5\' 3"  (1.6 m)   Wt 180 lb (81.6 kg)   SpO2 100%   BMI 31.89 kg/m   Visual Acuity Right Eye Distance:   Left Eye Distance:   Bilateral Distance:    Right Eye Near:   Left Eye Near:    Bilateral Near:     Physical Exam Vitals and nursing note reviewed.  Constitutional:      General: She is not in acute distress.    Appearance: She is well-developed. She is not ill-appearing.  HENT:     Head: Normocephalic and atraumatic.  Neck:     Trachea: No tracheal deviation.     Comments: No carotid bruits present on exam bilaterally. Cardiovascular:     Rate and Rhythm: Normal rate and regular rhythm.     Chest Wall: PMI is not displaced.     Pulses:          Carotid pulses are 2+ on the right side and 2+ on the left side.      Radial pulses are 2+ on the right side and 2+ on the left side.       Dorsalis pedis pulses are 2+ on the right side and 2+ on the left side.       Posterior tibial pulses are 2+ on the right side and 2+ on the left side.     Heart sounds: Normal heart sounds. No murmur heard. No friction rub.  Pulmonary:     Effort: Pulmonary effort is normal. No respiratory distress.     Breath sounds: No decreased breath sounds, wheezing or rales.  Musculoskeletal:        General: Normal range of motion.     Cervical back: Normal range of motion and neck supple.     Right lower leg: No tenderness. No edema.     Left lower leg: No tenderness. No edema.  Lymphadenopathy:     Cervical: No cervical adenopathy.  Skin:    General: Skin is warm and dry.  Capillary Refill: Capillary refill takes less than 2 seconds.     Findings: No erythema or rash.  Neurological:     General: No focal deficit present.     Mental Status: She is alert and oriented to person, place, and time.  Psychiatric:        Mood and Affect: Mood  normal.        Behavior: Behavior normal.      UC Treatments / Results  Labs (all labs ordered are listed, but only abnormal results are displayed) Labs Reviewed  COMPREHENSIVE METABOLIC PANEL - Abnormal; Notable for the following components:      Result Value   Glucose, Bld 103 (*)    All other components within normal limits  TROPONIN I (HIGH SENSITIVITY)    EKG  EKG shows normal sinus rhythm, normal axis, with ventricular rate of 61, PR interval of 158 ms and QRS duration of 92 ms.  No old EKGs for comparison in system.   Radiology No results found.  Procedures Procedures (including critical care time)  Medications Ordered in UC Medications - No data to display  Initial Impression / Assessment and Plan / UC Course  I have reviewed the triage vital signs and the nursing notes.  Pertinent labs & imaging results that were available during my care of the patient were reviewed by me and considered in my medical decision making (see chart for details).   Patient is a very pleasant 50 year old female here for evaluation of episodes of left-sided chest pain that started yesterday.  The pain started when she was at rest sitting on the couch, occurred again this morning when she was in bed, and later in the morning when she was cleaning her daughter's room.  She describes them as 4-5 quick stabbing pains on the left side of her chest that have no provoking or alleviating factors and have no associated symptoms.  They come on and go away just as quickly.  She never had any like this before.  Heart sounds are S1-S2 and crisp with normal PMI placement.  No carotid bruits on exam.  Lungs are clear to auscultation all fields.  Peripheral pulses are 2+ globally.  No peripheral edema.  EKG obtained at triage.  Will check CMP for electrolyte imbalance given that she did have one episode of heart fluttering yesterday and we will also check a troponin.  Very low index of suspicion for anything  cardiac.  CMP shows elevated glucose of 103 but otherwise normal.  Troponin is 3.  Patient's lab work is normal and I do not suspect that her chest pain is a result of anything cardiac.  Patient has a heart score of 2 and low risk factors other than obesity.  Will have patient follow-up with her PCP for atypical chest pain.   Final Clinical Impressions(s) / UC Diagnoses   Final diagnoses:  Atypical chest pain     Discharge Instructions     Your EKG and blood work today did not reveal the presence of any cardiac disease.  I am unsure what is causing your intermittent chest pain.  I recommend making an appointment to follow-up with your primary care provider to discuss this further.    ED Prescriptions    None     PDMP not reviewed this encounter.   Becky Augusta, NP 01/15/21 1217

## 2021-01-15 NOTE — ED Triage Notes (Signed)
Patient states that she started having sharp chest pain that is intermittent. Patient states that she will have 4-5 episodes in 1 hour times. Describes as "Someone stabbing me in my heart with a pencil". States that it started around 8 pm last night.

## 2021-01-22 ENCOUNTER — Other Ambulatory Visit: Payer: Self-pay | Admitting: Obstetrics & Gynecology

## 2021-01-22 DIAGNOSIS — E669 Obesity, unspecified: Secondary | ICD-10-CM

## 2021-01-27 NOTE — Telephone Encounter (Signed)
This encounter was created in error - please disregard.

## 2021-02-27 ENCOUNTER — Ambulatory Visit: Payer: Managed Care, Other (non HMO) | Admitting: Obstetrics & Gynecology

## 2021-03-03 ENCOUNTER — Ambulatory Visit: Payer: Managed Care, Other (non HMO) | Admitting: Obstetrics & Gynecology

## 2021-03-25 ENCOUNTER — Encounter: Payer: Self-pay | Admitting: Obstetrics & Gynecology

## 2021-03-25 ENCOUNTER — Other Ambulatory Visit: Payer: Self-pay

## 2021-03-25 ENCOUNTER — Other Ambulatory Visit (HOSPITAL_COMMUNITY)
Admission: RE | Admit: 2021-03-25 | Discharge: 2021-03-25 | Disposition: A | Discharge status: Home / Self Care | Payer: Managed Care, Other (non HMO) | Source: Ambulatory Visit | Attending: Obstetrics & Gynecology | Admitting: Obstetrics & Gynecology

## 2021-03-25 ENCOUNTER — Ambulatory Visit (INDEPENDENT_AMBULATORY_CARE_PROVIDER_SITE_OTHER): Payer: Managed Care, Other (non HMO) | Admitting: Obstetrics & Gynecology

## 2021-03-25 VITALS — BP 120/80 | Ht 63.0 in | Wt 187.0 lb

## 2021-03-25 DIAGNOSIS — Z87410 Personal history of cervical dysplasia: Secondary | ICD-10-CM

## 2021-03-25 DIAGNOSIS — E669 Obesity, unspecified: Secondary | ICD-10-CM | POA: Diagnosis not present

## 2021-03-25 DIAGNOSIS — Z01419 Encounter for gynecological examination (general) (routine) without abnormal findings: Secondary | ICD-10-CM | POA: Diagnosis not present

## 2021-03-25 NOTE — Progress Notes (Signed)
HPI:      [name] is a 50 y.o. [id number] who LMP was Patient's last menstrual period was 09/21/2020., she presents today for her annual examination. The patient has no complaints today.  Occas night sweat.  Weight gain, but working on it w diet/exercise.  Has not seen Bariatrics due to timing of getting appointments.   The patient is sexually active. Her last pap: approximate date 6 mos ago and was normal and has had prior dysplasia (last 2 normal though); and last mammogram: approximate date 10/2020 and was normal. The patient does perform self breast exams.  There is notable family history of breast or ovarian cancer in her family.  The patient has regular exercise: yes.  The patient denies current symptoms of depression.    GYN History: Contraception: tubal ligation  PMHx: Past Medical History:  Diagnosis Date  . Family history of breast cancer in female    genetic tesing letter sent 6/16, 8/18, 3/21  . Low iron   . Wears contact lenses    Past Surgical History:  Procedure Laterality Date  . CESAREAN SECTION    . COLONOSCOPY WITH PROPOFOL N/A 10/01/2020   Procedure: COLONOSCOPY WITH PROPOFOL;  Surgeon: Pasty Spillers, MD;  Location: [facility] ENDOSCOPY;  Service: Endoscopy;  Laterality: N/A;  . TONSILLECTOMY    . TUBAL LIGATION    . WISDOM TOOTH EXTRACTION     Family History  Problem Relation Age of Onset  . Breast cancer Father 66   Social History   Tobacco Use  . Smoking status: Never Smoker  . Smokeless tobacco: Never Used  Vaping Use  . Vaping Use: Never used  Substance Use Topics  . Alcohol use: Yes    Alcohol/week: 6.0 standard drinks    Types: 6 Cans of beer per week  . Drug use: No   No current outpatient medications on file. Allergies: Patient has no known allergies.  Review of Systems  Constitutional: Negative for chills, fever and malaise/fatigue.  HENT: Negative for congestion, sinus pain and sore throat.   Eyes: Negative for blurred vision  and pain.  Respiratory: Negative for cough and wheezing.   Cardiovascular: Negative for chest pain and leg swelling.  Gastrointestinal: Negative for abdominal pain, constipation, diarrhea, heartburn, nausea and vomiting.  Genitourinary: Negative for dysuria, frequency, hematuria and urgency.  Musculoskeletal: Negative for back pain, joint pain, myalgias and neck pain.  Skin: Negative for itching and rash.  Neurological: Negative for dizziness, tremors and weakness.  Endo/Heme/Allergies: Does not bruise/bleed easily.  Psychiatric/Behavioral: Negative for depression. The patient is not nervous/anxious and does not have insomnia.     Objective: BP 120/80   Ht 5\' 3"  (1.6 m)   Wt 187 lb (84.8 kg)   LMP 09/21/2020   BMI 33.13 kg/m   Filed Weights   03/25/21 0800  Weight: 187 lb (84.8 kg)   Body mass index is 33.13 kg/m. Physical Exam Constitutional:      General: She is not in acute distress.    Appearance: She is well-developed.  Genitourinary:     Bladder and rectum normal.     No lesions in the vagina.     No vaginal bleeding.     No vaginal atrophy present.     Right Adnexa: not tender and no mass present.    Left Adnexa: not tender and no mass present.    No cervical motion tenderness, friability, lesion or polyp.     Uterus is not enlarged.  No uterine mass detected.    Uterus exam comments: Slightly enlarged and bulbous shaped uterus.     Uterus is anteverted.     Pelvic exam was performed with patient in the lithotomy position.  Breasts:     Right: No mass, skin change or tenderness.     Left: No mass, skin change or tenderness.    HENT:     Head: Normocephalic and atraumatic. No laceration.     Right Ear: Hearing normal.     Left Ear: Hearing normal.     Mouth/Throat:     Pharynx: Uvula midline.  Eyes:     Pupils: Pupils are equal, round, and reactive to light.  Neck:     Thyroid: No thyromegaly.  Cardiovascular:     Rate and Rhythm: Normal rate and  regular rhythm.     Heart sounds: No murmur heard. No friction rub. No gallop.   Pulmonary:     Effort: Pulmonary effort is normal. No respiratory distress.     Breath sounds: Normal breath sounds. No wheezing.  Abdominal:     General: Bowel sounds are normal. There is no distension.     Palpations: Abdomen is soft.     Tenderness: There is no abdominal tenderness. There is no rebound.  Musculoskeletal:        General: Normal range of motion.     Cervical back: Normal range of motion and neck supple.  Neurological:     Mental Status: She is alert and oriented to person, place, and time.     Cranial Nerves: No cranial nerve deficit.  Skin:    General: Skin is warm and dry.  Psychiatric:        Judgment: Judgment normal.  Vitals reviewed.     Assessment:  ANNUAL EXAM 1. Women's annual routine gynecological examination   2. History of cervical dysplasia   3. Obesity (BMI 30-39.9)      Screening Plan:            1.  Cervical Screening-  Pap smear done today Revert to yearly if normal today  2. Breast screening- Exam annually and mammogram>40 planned   3. Colonoscopy every 10 years, Hemoccult testing - after age 6  4. Labs - works for [employer] and has done yearly; will try to get copy of recent results  5. Counseling for contraception: none needed  Likely menopausal (no period in 8 mos) Monitor dx's, no intervention needed now  6.Rec she start seeing a PCP for other health care needs List given of possibilities    F/U  Return in about 1 year (around 03/25/2022) for Annual.  Annamarie Major, MD, [facility], [facility] 03/25/2021  8:25 AM

## 2021-03-25 NOTE — Patient Instructions (Signed)
PAP every three years Mammogram every year    Call [phone] to schedule at [facility] Colonoscopy every 10 years Labs yearly (with PCP)  Thank you for choosing [facility]. As part of our ongoing efforts to improve patient experience, we would appreciate your feedback. Please fill out the short survey that you will receive by mail or MyChart. Your opinion is important to [place]! - Dr. Tiburcio Pea  Primary Care in the area  This is not meant to be comprehensive list, but a resource for some providers that you can call for counseling or medication needs. If you have a recommendation, please let us know.   [facility]:  [phone]               Dr Julieanne Manson               Dr Jamse Belfast               Dr Shirlee Latch  [facility]:  [phone]  Dr Alba Cory      (and others)  [facility], [place]:   [phone]  Hannah Beat, MD  Gweneth Dimitri, MD  Eustaquio Boyden, MD  Excell Seltzer, MD  [facility], [place]: [phone]  Quentin Ore, MD   (and others)

## 2021-03-27 LAB — CYTOLOGY - PAP: Diagnosis: UNDETERMINED — AB

## 2021-11-07 ENCOUNTER — Other Ambulatory Visit: Payer: Self-pay | Admitting: Obstetrics & Gynecology

## 2021-11-07 DIAGNOSIS — Z1231 Encounter for screening mammogram for malignant neoplasm of breast: Secondary | ICD-10-CM

## 2021-11-13 ENCOUNTER — Other Ambulatory Visit: Payer: Self-pay

## 2021-11-13 ENCOUNTER — Ambulatory Visit
Admission: RE | Admit: 2021-11-13 | Discharge: 2021-11-13 | Disposition: A | Discharge status: Home / Self Care | Payer: Managed Care, Other (non HMO) | Source: Ambulatory Visit | Attending: Obstetrics & Gynecology | Admitting: Obstetrics & Gynecology

## 2021-11-13 DIAGNOSIS — Z1231 Encounter for screening mammogram for malignant neoplasm of breast: Secondary | ICD-10-CM | POA: Insufficient documentation

## 2022-03-05 ENCOUNTER — Encounter: Payer: Self-pay | Admitting: Obstetrics & Gynecology

## 2022-03-13 ENCOUNTER — Telehealth: Payer: Managed Care, Other (non HMO) | Admitting: Emergency Medicine

## 2022-03-13 DIAGNOSIS — Z5189 Encounter for other specified aftercare: Secondary | ICD-10-CM | POA: Diagnosis not present

## 2022-03-13 MED ORDER — CEPHALEXIN 500 MG PO CAPS: 500.0000 mg | ORAL_CAPSULE | Freq: Two times a day (BID) | ORAL | 0 refills | Status: AC

## 2022-03-13 NOTE — Patient Instructions (Signed)
Take medications as directed.   ? ?Continue using neosporin. ? ?Take a photo daily to track the progression. ? ?If you develop fever or if your symptoms worsen you should be seen in person. ?

## 2022-03-13 NOTE — Progress Notes (Signed)
?Virtual Visit Consent  ? ?Jamie Higgins, you are scheduled for a virtual visit with a [facility] provider today.   ?  ?Just as with appointments in the office, your consent must be obtained to participate.  Your consent will be active for this visit and any virtual visit you may have with one of our providers in the next 365 days.   ?  ?If you have a MyChart account, a copy of this consent can be sent to you electronically.  All virtual visits are billed to your insurance company just like a traditional visit in the office.   ? ?As this is a virtual visit, video technology does not allow for your provider to perform a traditional examination.  This may limit your provider's ability to fully assess your condition.  If your provider identifies any concerns that need to be evaluated in person or the need to arrange testing (such as labs, EKG, etc.), we will make arrangements to do so.   ?  ?Although advances in technology are sophisticated, we cannot ensure that it will always work on either your end or our end.  If the connection with a video visit is poor, the visit may have to be switched to a telephone visit.  With either a video or telephone visit, we are not always able to ensure that we have a secure connection.    ? ?I need to obtain your verbal consent now.   Are you willing to proceed with your visit today?  ?  ?Jamie Higgins has provided verbal consent on 03/13/2022 for a virtual visit (video or telephone). ?  ?Montine Circle, PA-C  ? ?Date: 03/13/2022 3:05 PM ? ? ?Virtual Visit via Video Note  ? ?IMontine Circle, connected with  Jamie Higgins  ([id number], 12/18/1970) on 03/13/22 at  3:00 PM EDT by a video-enabled telemedicine application and verified that I am speaking with the correct person using two identifiers. ? ?Location: ?Patient: Virtual Visit Location Patient: Home ?Provider: Virtual Visit Location Provider: Home Office ?  ?I discussed the limitations of evaluation and management by  telemedicine and the availability of in person appointments. The patient expressed understanding and agreed to proceed.   ? ?History of Present Illness: ?Jamie Higgins is a 51 y.o. who identifies as a female who was assigned female at birth, and is being seen today for possible skin infection. She states that she scratched herself on a dirty/rusty rake last night while doing yard work.  She got a tetanus shot updated today.  She states that she can feel some warmth and pain around the scratch and questions if there is an infection starting.  She denies puncture wound.  She has been using neosporin. ? ?HPI: HPI  ?Problems:  ?Patient Active Problem List  ? Diagnosis Date Noted  ? Encounter for screening colonoscopy   ? Overweight (BMI 25.0-29.9) 03/17/2018  ? History of cervical dysplasia 01/21/2018  ? Obesity (BMI 30-39.9) 06/14/2017  ? Low grade squamous intraepith lesion on cytologic smear cervix (lgsil) 06/14/2017  ?  ?Allergies: No Known Allergies ?Medications:  ?Current Outpatient Medications:  ?  cephALEXin (KEFLEX) 500 MG capsule, Take 1 capsule (500 mg total) by mouth 2 (two) times daily., Disp: 14 capsule, Rfl: 0 ? ?Observations/Objective: ?Patient is well-developed, well-nourished in no acute distress.  ?Resting comfortably  at home.  ?Head is normocephalic, atraumatic.  ?No labored breathing.  ?Speech is clear and coherent with logical content.  ?Patient is alert and  oriented at baseline.  ?Mildly erythematous scratch to left upper arm without evidence of abscess ? ?Assessment and Plan: ?1. Visit for wound check ? ? ?- Continue neosporin ?- Add Keflex ?- Take a photo daily for monitoring ? ?Follow Up Instructions: ?I discussed the assessment and treatment plan with the patient. The patient was provided an opportunity to ask questions and all were answered. The patient agreed with the plan and demonstrated an understanding of the instructions.  A copy of instructions were sent to the patient via MyChart  unless otherwise noted below.  ? ? ?The patient was advised to call back or seek an in-person evaluation if the symptoms worsen or if the condition fails to improve as anticipated. ? ?Time:  ?I spent 10 minutes with the patient via telehealth technology discussing the above problems/concerns.   ? ?Montine Circle, PA-C ? ?

## 2023-01-22 DIAGNOSIS — Z01419 Encounter for gynecological examination (general) (routine) without abnormal findings: Secondary | ICD-10-CM | POA: Insufficient documentation

## 2023-01-22 NOTE — Progress Notes (Unsigned)
Tomasita Morrow, NP-C Phone: [phone]  Jamie Higgins is a 52 y.o. female who presents today to establish care and for annual exam/well woman exam with pap.  Diet: *** Exercise: *** Pap smear: 03/25/2021- [facility]- needed repeat in October 2022.  Colonoscopy: 10/01/2020- 10 year recall Mammogram: 11/13/2021- Due! Family history-  Colon cancer: ***  Breast cancer: ***  Ovarian cancer: *** Menses: *** Sexually active: *** Vaccines-   Flu: ***  Tetanus: ***  Shingles: ***  COVID19: x 3 HIV screening: *** Hep C Screening: *** Tobacco use: *** Alcohol use: *** Illicit Drug use: *** Dentist: *** Ophthalmology: ***   Active Ambulatory Problems    Diagnosis Date Noted   Obesity (BMI 30-39.9) 06/14/2017   Low grade squamous intraepith lesion on cytologic smear cervix (lgsil) 06/14/2017   History of cervical dysplasia 01/21/2018   Overweight (BMI 25.0-29.9) 03/17/2018   Encounter for screening colonoscopy    Resolved Ambulatory Problems    Diagnosis Date Noted   No Resolved Ambulatory Problems   Past Medical History:  Diagnosis Date   Family history of breast cancer in female    Low iron    Wears contact lenses     Family History  Problem Relation Age of Onset   Breast cancer Father 12    Social History   Socioeconomic History   Marital status: Legally Separated    Spouse name: Not on file   Number of children: Not on file   Years of education: Not on file   Highest education level: Not on file  Occupational History   Not on file  Tobacco Use   Smoking status: Never   Smokeless tobacco: Never  Vaping Use   Vaping Use: Never used  Substance and Sexual Activity   Alcohol use: Yes    Alcohol/week: 6.0 standard drinks of alcohol    Types: 6 Cans of beer per week   Drug use: No   Sexual activity: Yes    Birth control/protection: None  Other Topics Concern   Not on file  Social History Narrative   Not on file   Social Determinants of Health   Financial  Resource Strain: Not on file  Food Insecurity: Not on file  Transportation Needs: Not on file  Physical Activity: Not on file  Stress: Not on file  Social Connections: Not on file  Intimate Partner Violence: Not on file    ROS  General:  Negative for nexplained weight loss, fever Skin: Negative for new or changing mole, sore that won't heal HEENT: Negative for trouble hearing, trouble seeing, ringing in ears, mouth sores, hoarseness, change in voice, dysphagia. CV:  Negative for chest pain, dyspnea, edema, palpitations Resp: Negative for cough, dyspnea, hemoptysis GI: Negative for nausea, vomiting, diarrhea, constipation, abdominal pain, melena, hematochezia. GU: Negative for dysuria, incontinence, urinary hesitance, hematuria, vaginal or penile discharge, polyuria, sexual difficulty, lumps in testicle or breasts MSK: Negative for muscle cramps or aches, joint pain or swelling Neuro: Negative for headaches, weakness, numbness, dizziness, passing out/fainting Psych: Negative for depression, anxiety, memory problems  Objective  Physical Exam There were no vitals filed for this visit.  BP Readings from Last 3 Encounters:  03/25/21 120/80  01/15/21 90/69  10/01/20 (!) 102/46   Wt Readings from Last 3 Encounters:  03/25/21 187 lb (84.8 kg)  01/15/21 180 lb (81.6 kg)  10/01/20 186 lb 1.1 oz (84.4 kg)    Physical Exam   Assessment/Plan:   There are no diagnoses linked to this encounter.  No follow-ups on file.   Tomasita Morrow, NP-C [place]

## 2023-01-25 ENCOUNTER — Encounter: Payer: Self-pay | Admitting: Nurse Practitioner

## 2023-01-25 ENCOUNTER — Other Ambulatory Visit (HOSPITAL_COMMUNITY)
Admission: RE | Admit: 2023-01-25 | Discharge: 2023-01-25 | Disposition: A | Discharge status: Home / Self Care | Payer: Managed Care, Other (non HMO) | Source: Ambulatory Visit | Attending: Nurse Practitioner | Admitting: Nurse Practitioner

## 2023-01-25 ENCOUNTER — Ambulatory Visit: Payer: Managed Care, Other (non HMO) | Admitting: Nurse Practitioner

## 2023-01-25 VITALS — BP 124/82 | HR 92 | Temp 98.1°F | Ht 63.0 in | Wt 160.6 lb

## 2023-01-25 DIAGNOSIS — Z1231 Encounter for screening mammogram for malignant neoplasm of breast: Secondary | ICD-10-CM

## 2023-01-25 DIAGNOSIS — Z0001 Encounter for general adult medical examination with abnormal findings: Secondary | ICD-10-CM

## 2023-01-25 DIAGNOSIS — Z124 Encounter for screening for malignant neoplasm of cervix: Secondary | ICD-10-CM

## 2023-01-25 DIAGNOSIS — Z1159 Encounter for screening for other viral diseases: Secondary | ICD-10-CM

## 2023-01-25 DIAGNOSIS — Z1329 Encounter for screening for other suspected endocrine disorder: Secondary | ICD-10-CM

## 2023-01-25 DIAGNOSIS — Z13220 Encounter for screening for lipoid disorders: Secondary | ICD-10-CM

## 2023-01-25 DIAGNOSIS — J40 Bronchitis, not specified as acute or chronic: Secondary | ICD-10-CM

## 2023-01-25 DIAGNOSIS — Z01419 Encounter for gynecological examination (general) (routine) without abnormal findings: Secondary | ICD-10-CM | POA: Diagnosis present

## 2023-01-25 DIAGNOSIS — Z114 Encounter for screening for human immunodeficiency virus [HIV]: Secondary | ICD-10-CM

## 2023-01-25 DIAGNOSIS — Z0000 Encounter for general adult medical examination without abnormal findings: Secondary | ICD-10-CM

## 2023-01-25 MED ORDER — AZITHROMYCIN 250 MG PO TABS: ORAL_TABLET | ORAL | 0 refills | Status: AC

## 2023-01-25 MED ORDER — METHYLPREDNISOLONE 4 MG PO TBPK: ORAL_TABLET | ORAL | 0 refills | Status: AC

## 2023-01-25 NOTE — Patient Instructions (Addendum)
It was nice to meet you.   I will contact you with your lab results.   I have sent 2 prescriptions to your pharmacy. Make sure you are drinking plenty of fluids. Please let me know if your symptoms are not improving.   I have sent the order for your mammogram. Please call to schedule. [phone]

## 2023-01-25 NOTE — Assessment & Plan Note (Signed)
Physical exam complete. Lab work as outlined. Will contact patient with results. Pap today. Colon- UTD. Mammo due- order placed, encouraged patient to call to schedule. Declined flu vaccine. Interested in Shingles vaccine, information provided to patient. Tetanus vaccine UTD. Declined HIV/Hep C screenings. Advised annual follow ups with Dentist and Ophthalmology. Encouraged healthy diet and exercise.

## 2023-01-25 NOTE — Assessment & Plan Note (Addendum)
Symptoms and duration consistent with bronchitis. Will treat with Zpak and MDP. Patient has inhaler at home to use. Encouraged adequate fluid intake. Tylenol/Ibuprofen PRN. She will return if symptoms do not improve or are worsening.

## 2023-01-25 NOTE — Assessment & Plan Note (Addendum)
Gyn exam WNL. Pap today. Hx of abnormal paps- last in April 2022 ([facility]). Will refer to Ob-Gyn if results abnormal. Patient considering hysterectomy. Mammo due- order placed, encouraged to call to schedule.

## 2023-01-26 LAB — CBC WITH DIFFERENTIAL/PLATELET

## 2023-01-27 ENCOUNTER — Other Ambulatory Visit: Payer: Self-pay | Admitting: Nurse Practitioner

## 2023-01-27 ENCOUNTER — Encounter: Payer: Self-pay | Admitting: Nurse Practitioner

## 2023-01-27 DIAGNOSIS — R718 Other abnormality of red blood cells: Secondary | ICD-10-CM

## 2023-01-27 DIAGNOSIS — D582 Other hemoglobinopathies: Secondary | ICD-10-CM

## 2023-01-27 LAB — CBC WITH DIFFERENTIAL/PLATELET
Basophils Absolute: 0.1 10*3/uL (ref 0.0–0.2)
Basos: 1 %
EOS (ABSOLUTE): 0.1 10*3/uL (ref 0.0–0.4)
Eos: 2 %
Hematocrit: 48.9 % — ABNORMAL HIGH (ref 34.0–46.6)
Hemoglobin: 16.2 g/dL — ABNORMAL HIGH (ref 11.1–15.9)
Immature Grans (Abs): 0 10*3/uL (ref 0.0–0.1)
Immature Granulocytes: 0 %
Lymphocytes Absolute: 2.1 10*3/uL (ref 0.7–3.1)
Lymphs: 28 %
MCH: 32.9 pg (ref 26.6–33.0)
MCHC: 33.1 g/dL (ref 31.5–35.7)
MCV: 99 fL — ABNORMAL HIGH (ref 79–97)
Monocytes Absolute: 0.4 10*3/uL (ref 0.1–0.9)
Monocytes: 6 %
Neutrophils Absolute: 4.8 10*3/uL (ref 1.4–7.0)
Neutrophils: 63 %
Platelets: 223 10*3/uL (ref 150–450)
RBC: 4.93 x10E6/uL (ref 3.77–5.28)
RDW: 12.7 % (ref 11.7–15.4)
WBC: 7.5 10*3/uL (ref 3.4–10.8)

## 2023-01-27 LAB — COMPREHENSIVE METABOLIC PANEL
ALT: 18 IU/L (ref 0–32)
AST: 17 IU/L (ref 0–40)
Albumin/Globulin Ratio: 2.1 (ref 1.2–2.2)
Albumin: 4.5 g/dL (ref 3.8–4.9)
Alkaline Phosphatase: 100 IU/L (ref 44–121)
BUN/Creatinine Ratio: 7 — ABNORMAL LOW (ref 9–23)
BUN: 6 mg/dL (ref 6–24)
Bilirubin Total: 0.6 mg/dL (ref 0.0–1.2)
CO2: 23 mmol/L (ref 20–29)
Calcium: 9.7 mg/dL (ref 8.7–10.2)
Chloride: 102 mmol/L (ref 96–106)
Creatinine, Ser: 0.81 mg/dL (ref 0.57–1.00)
Globulin, Total: 2.1 g/dL (ref 1.5–4.5)
Glucose: 92 mg/dL (ref 70–99)
Potassium: 3.8 mmol/L (ref 3.5–5.2)
Sodium: 143 mmol/L (ref 134–144)
Total Protein: 6.6 g/dL (ref 6.0–8.5)
eGFR: 87 mL/min/{1.73_m2} (ref >59)

## 2023-01-27 LAB — CYTOLOGY - PAP
Chlamydia: NEGATIVE
Comment: NEGATIVE
Comment: NEGATIVE
Comment: NEGATIVE
Comment: NORMAL
Diagnosis: UNDETERMINED — AB
High risk HPV: NEGATIVE
Neisseria Gonorrhea: NEGATIVE
Trichomonas: NEGATIVE

## 2023-01-27 LAB — LIPID PANEL
Chol/HDL Ratio: 2.4 ratio (ref 0.0–4.4)
Cholesterol, Total: 210 mg/dL — ABNORMAL HIGH (ref 100–199)
HDL: 89 mg/dL (ref >39)
LDL Chol Calc (NIH): 107 mg/dL — ABNORMAL HIGH (ref 0–99)
Triglycerides: 77 mg/dL (ref 0–149)
VLDL Cholesterol Cal: 14 mg/dL (ref 5–40)

## 2023-01-27 LAB — VITAMIN D 25 HYDROXY (VIT D DEFICIENCY, FRACTURES): Vit D, 25-Hydroxy: 44.3 ng/mL (ref 30.0–100.0)

## 2023-01-27 LAB — HEMOGLOBIN A1C
Est. average glucose Bld gHb Est-mCnc: 105 mg/dL
Hgb A1c MFr Bld: 5.3 % (ref 4.8–5.6)

## 2023-01-27 LAB — TSH: TSH: 1.4 u[IU]/mL (ref 0.450–4.500)

## 2023-03-14 IMAGING — MG MM DIGITAL SCREENING BILAT W/ TOMO AND CAD
8 series · 8 of 24 positions shown · non-contrast
Comparison: Previous exam(s).

CLINICAL DATA: Screening.

EXAM:
DIGITAL SCREENING BILATERAL MAMMOGRAM WITH TOMOSYNTHESIS AND CAD
TECHNIQUE: Bilateral screening digital craniocaudal and mediolateral oblique
mammograms were obtained. Bilateral screening digital breast
tomosynthesis was performed. The images were evaluated with
computer-aided detection.

[L MLO synth-2D]
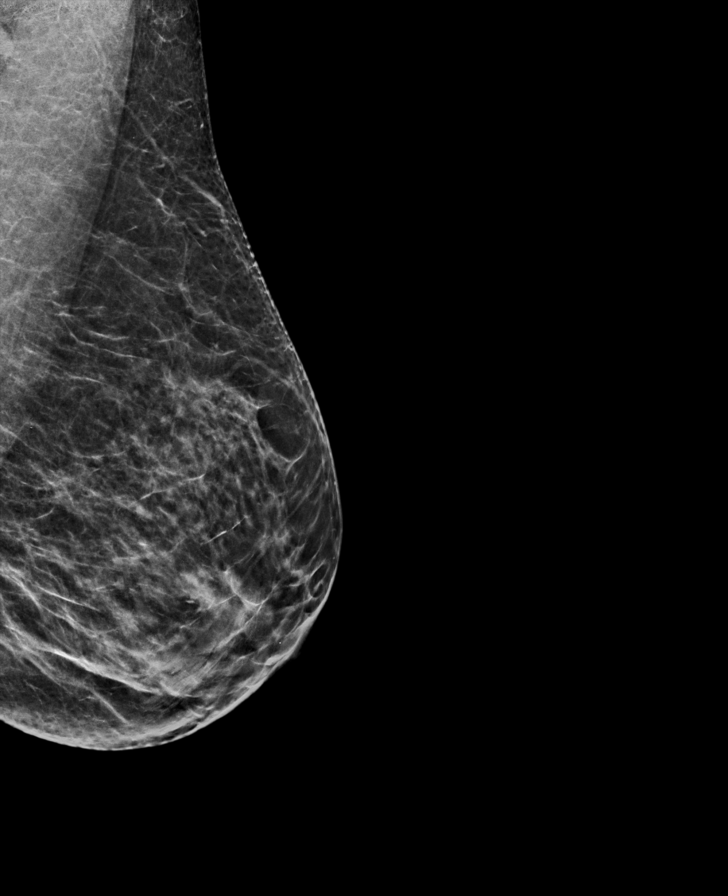

[R MLO synth-2D]
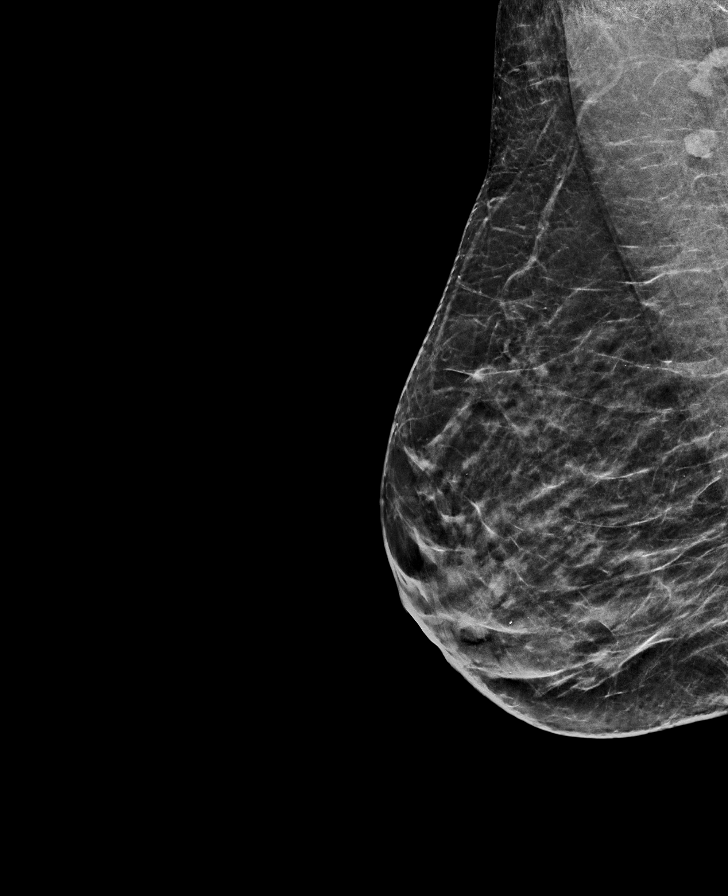

[L CC synth-2D]
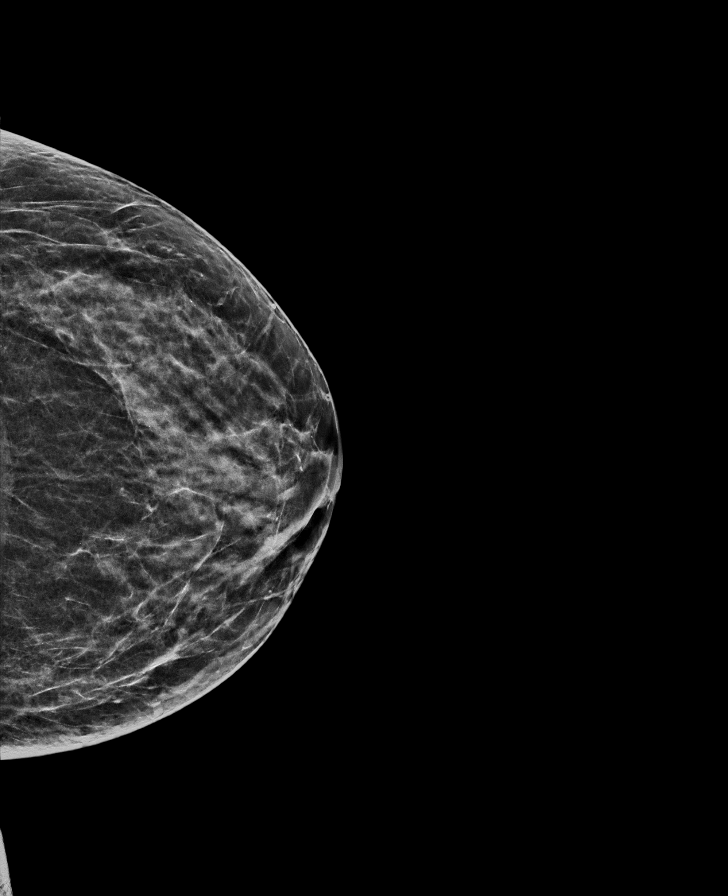

[R CC synth-2D]
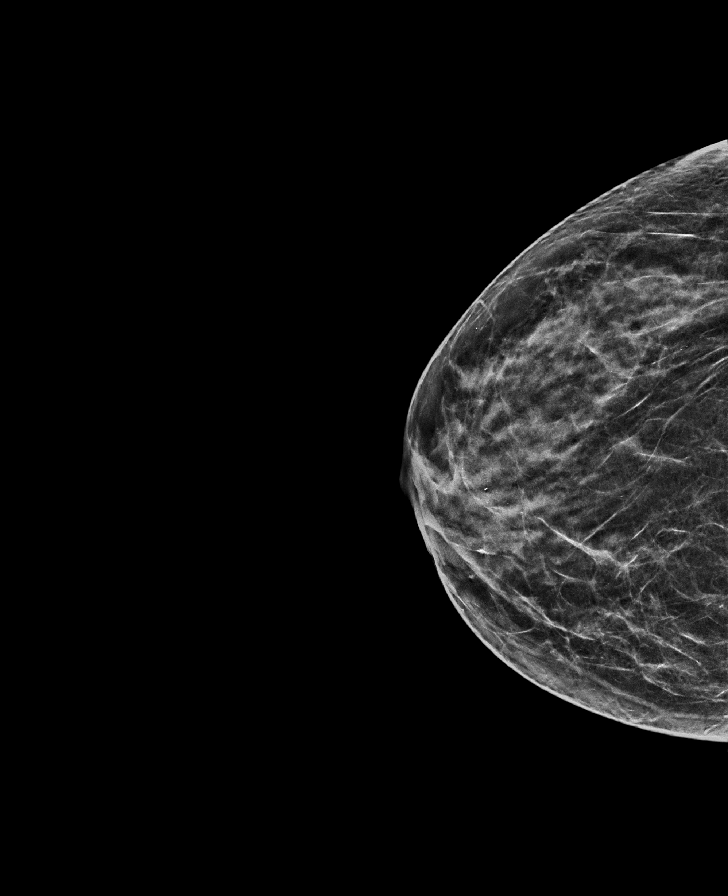

[L CC tomo · tomo slice 29/58.0]
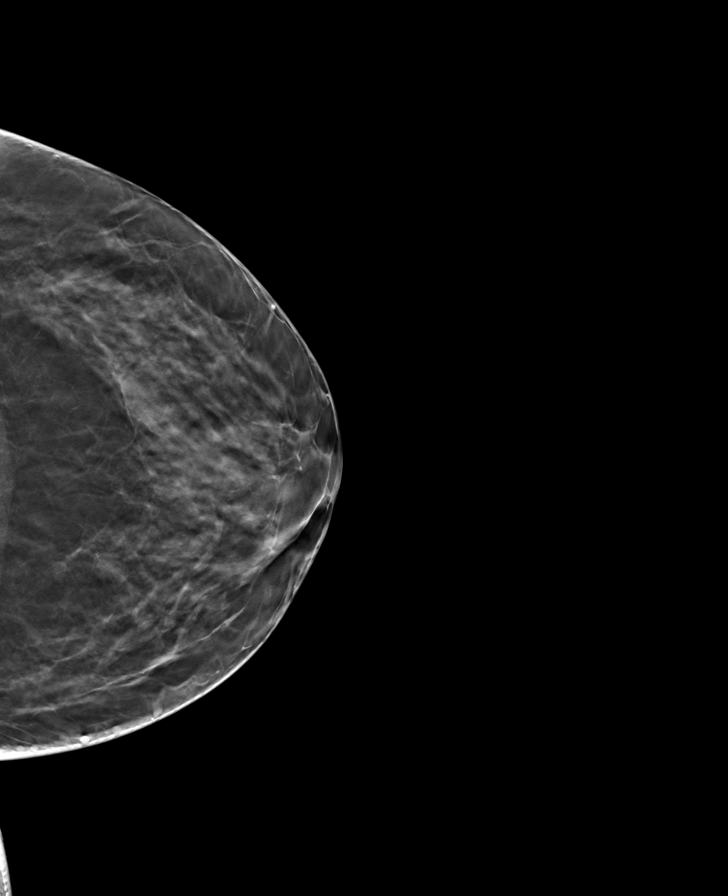

[L MLO tomo · tomo slice 31/60.0]
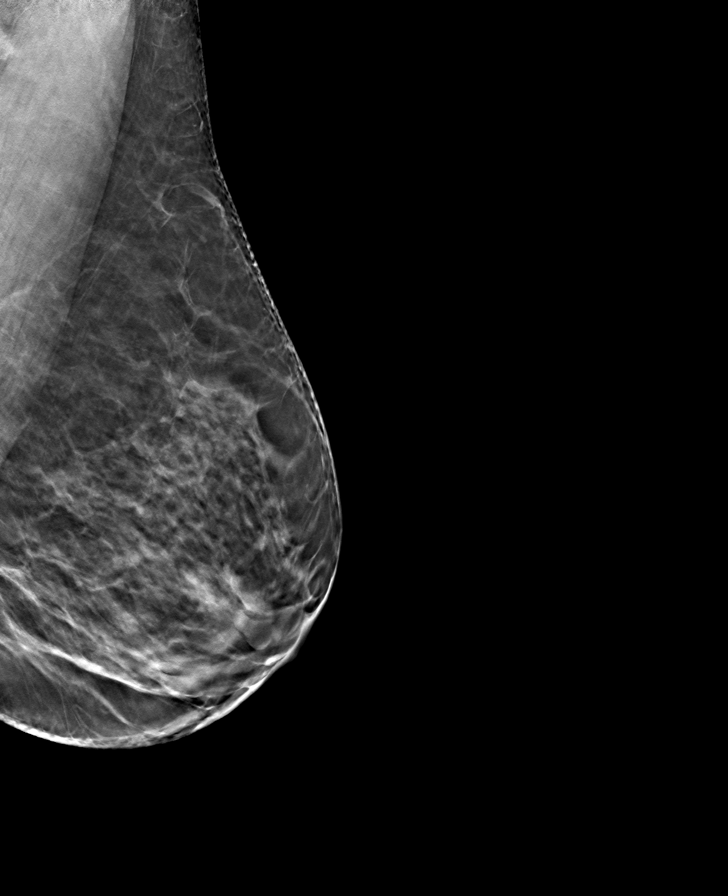

[R MLO tomo · tomo slice 31/60.0]
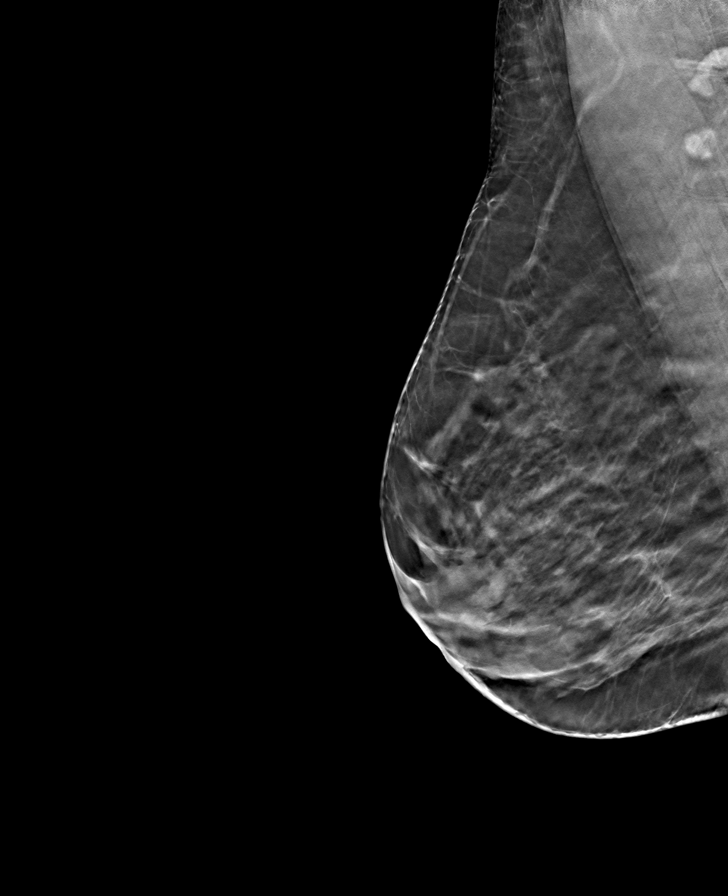

[R CC tomo · tomo slice 29/57.0]
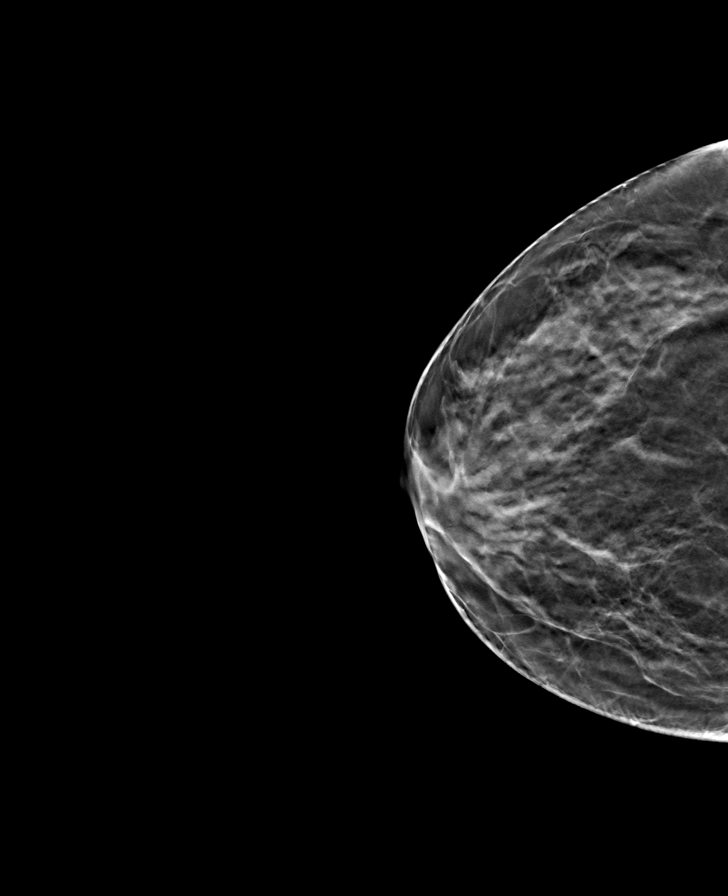

[8 of 24 positions shown; findings below may reference images not displayed]

ACR Breast Density Category c: The breast tissue is heterogeneously
dense, which may obscure small masses.
FINDINGS: There are no findings suspicious for malignancy.
IMPRESSION: No mammographic evidence of malignancy. A result letter of this
screening mammogram will be mailed directly to the patient.

RECOMMENDATION:
Screening mammogram in one year. (Code:Q3-W-BC3)

BI-RADS CATEGORY  1: Negative.

## 2023-12-08 NOTE — Progress Notes (Signed)
 GYNECOLOGY: ANNUAL EXAM   Subjective:    PCP: Jamie App, NP DANISE DEHNE is a 53 y.o. female [phone] who presents for annual wellness visit. She has had abnormal paps for the past several years with her PCP, and used to see Dr. Arloa.    Well Woman Visit:  GYN HISTORY:  Patient's last menstrual period was 09/21/2020.     Menstrual History: OB History     Gravida  5   Para  5   Term  4   Preterm  1   AB  0   Living  4      SAB      IAB  0   Ectopic      Multiple      Live Births              Menarche age: 78 Patient's last menstrual period was 09/21/2020.   Post-menopausal bleeding: None  Urinary incontinence? no  Sexually active:no Number of sexual partners: 0 Gender of sexual Partners: male Social History   Substance and Sexual Activity  Sexual Activity Yes   Birth control/protection: None   Contraceptive methods: btl, menopause Dyspareunia? no STI history: no STI/HIV testing or immunizations needed? No.   Health Maintenance: -Last pap: was abnormal: ascus hpv neg, 01/25/23   --> Any abnormals: yes 03/25/21-ascus, hpv not tested 07/14/19-ascus,hpv not tested 01/23/19-Lsil,HPV,CIN1 06/14/17-ascus,hpv not tested  -Last mammogram: 11/13/21 next Scheduled 12/13/23 --> Any abnormals? no -Last colon cancer screen: 10/01/20 / Type: colonoscopy  -Last DEXA scan: no -[facility] / Colon / Cervical cancer: dad breast -Vaccines:  Immunization History  Administered Date(s) Administered   Moderna Sars-Covid-2 Vaccination 01/29/2020, 02/25/2020   PFIZER(Purple Top)SARS-COV-2 Vaccination 08/30/2020   Last Tdap: utd / Flu: declined / COVID: utd / Gardasil: age out / Shingles (50+): utd / PCV20: no -Hep C screen: today -Last lipid / glucose screening: utd  > Exercise: everyday does a little , moderately active > Dietary Supplements: Folate: Yes;  Calcium: Yes}; Vitamin D : Yes > Body mass index is 31.18 kg/m.  > Recent dental visit Yes.    > Seat Belt Use: Yes.   > Texting and driving? No. > Guns in the house Yes.   > Recreational or other drug use: denied.   Social History   Tobacco Use   Smoking status: Never   Smokeless tobacco: Never  Substance Use Topics   Alcohol use: Yes    Alcohol/week: 6.0 standard drinks of alcohol    Types: 6 Cans of beer per week   Occupation: [employer]    Lives with: self and kids    PHQ-2 Score: In last two weeks, how often have you felt: Little interest or pleasure in doing things: Not at all (0) Feeling down, depressed or hopeless: Not at all (0) Score: 0  GAD-2 Over the last 2 weeks, how often have you been bothered by the following problems? Feeling nervous, anxious or on edge: Not at all (0) Not being able to stop or control worrying: Not at all (0)} Score: 0 _________________________________________________________  Current Outpatient Medications  Medication Sig Dispense Refill   cephALEXin  (KEFLEX ) 500 MG capsule Take 1 capsule (500 mg total) by mouth 2 (two) times daily. (Patient not taking: Reported on 12/09/2023) 14 capsule 0   methylPREDNISolone  (MEDROL  DOSEPAK) 4 MG TBPK tablet Take as directed. (Patient not taking: Reported on 12/09/2023) 21 each 0   No current facility-administered medications for this visit.   No Known  Allergies  Past Medical History:  Diagnosis Date   Family history of breast cancer in female    genetic tesing letter sent 6/16, 8/18, 3/21   Low iron    Wears contact lenses    Past Surgical History:  Procedure Laterality Date   CESAREAN SECTION     COLONOSCOPY WITH PROPOFOL  N/A 10/01/2020   Procedure: COLONOSCOPY WITH PROPOFOL ;  Surgeon: Janalyn Keene NOVAK, MD;  Location: [facility] ENDOSCOPY;  Service: Endoscopy;  Laterality: N/A;   TONSILLECTOMY     TUBAL LIGATION     WISDOM TOOTH EXTRACTION      Review Of Systems  Constitutional: Denied constitutional symptoms, night sweats, recent illness, fatigue, fever, insomnia and weight loss.  Eyes:  Denied eye symptoms, eye pain, photophobia, vision change and visual disturbance.  Ears/Nose/Throat/Neck: Denied ear, nose, throat or neck symptoms, hearing loss, nasal discharge, sinus congestion and sore throat.  Cardiovascular: Denied cardiovascular symptoms, arrhythmia, chest pain/pressure, edema, exercise intolerance, orthopnea and palpitations.  Respiratory: Denied pulmonary symptoms, asthma, pleuritic pain, productive sputum, cough, dyspnea and wheezing.  Gastrointestinal: Denied, gastro-esophageal reflux, melena, nausea and vomiting.  Genitourinary: Denied genitourinary symptoms including symptomatic vaginal discharge, pelvic relaxation issues, and urinary complaints.  Musculoskeletal: Denied musculoskeletal symptoms, stiffness, swelling, muscle weakness and myalgia.  Dermatologic: Denied dermatology symptoms, rash and scar.  Neurologic: Denied neurology symptoms, dizziness, headache, neck pain and syncope.  Psychiatric: Denied psychiatric symptoms, anxiety and depression.  Endocrine: Denied endocrine symptoms including hot flashes and night sweats.      Objective:    BP 120/80   Ht 5' 3 (1.6 m)   Wt 176 lb (79.8 kg)   LMP 09/21/2020   BMI 31.18 kg/m   Constitutional: Well-developed, well-nourished female in no acute distress Neurological: Alert and oriented to person, place, and time Psychiatric: Mood and affect appropriate Skin: No rashes or lesions Neck: Supple without masses. Trachea is midline.Thyroid is normal size without masses Lymphatics: No cervical, axillary, supraclavicular, or inguinal adenopathy noted Respiratory: Clear to auscultation bilaterally. Good air movement with normal work of breathing. Cardiovascular: Regular rate and rhythm. Extremities grossly normal, nontender with no edema; pulses regular Gastrointestinal: Soft, nontender, nondistended. No masses or hernias appreciated. No hepatosplenomegaly. No fluid wave. No rebound or guarding. Breast Exam:  normal appearance, no masses or tenderness, Inspection negative, No nipple retraction or dimpling, No nipple discharge or bleeding, No axillary or supraclavicular adenopathy, Normal to palpation without dominant masses Genitourinary:         External Genitalia: Normal female genitalia    Vagina: Normal mucosa, no lesions.    Cervix: No lesions, normal size and consistency; no cervical motion tenderness; non-friable; Pap obtained.    Uterus: Normal size and contour; smooth, mobile, NT, anteverted. Adnexae: Non-palpable and non-tender Perineum/Anus: No lesions Rectal: deferred    Assessment/Plan:    Jamie Higgins is a 53 y.o. female [phone] with normal well-woman gynecologic exam.  -Screenings:  Pap: last w/ASCUS in 2022; cotesting today Mammogram: with PCP, scheduled for next week Colon: per PCP Labs: HepC, rest with PCP/work ([employer]) GAD/PHQ-2 = 0 -Contraception: post-menopausal, s/p BTL -Vaccines: UTD -Healthy lifestyle modifications discussed: multivitamin, diet, exercise, sunscreen, tobacco and alcohol use. Emphasized importance of regular physical activity.  -Calcium and Vit D recommendation reviewed.  -All questions answered to patient's satisfaction.   Return in about 1 year (around 12/08/2024) for Annual.    Estil Mangle, DO [facility] at [facility]

## 2023-12-08 NOTE — Patient Instructions (Signed)
 Preventive Care 58-54 Years Old, Female  Preventive care refers to lifestyle choices and visits with your health care provider that can promote health and wellness. Preventive care visits are also called wellness exams.  What can I expect for my preventive care visit?  Counseling  Your health care provider may ask you questions about your:  Medical history, including:  Past medical problems.  Family medical history.  Pregnancy history.  Current health, including:  Menstrual cycle.  Method of birth control.  Emotional well-being.  Home life and relationship well-being.  Sexual activity and sexual health.  Lifestyle, including:  Alcohol, nicotine or tobacco, and drug use.  Access to firearms.  Diet, exercise, and sleep habits.  Work and work Astronomer.  Sunscreen use.  Safety issues such as seatbelt and bike helmet use.  Physical exam  Your health care provider will check your:  Height and weight. These may be used to calculate your BMI (body mass index). BMI is a measurement that tells if you are at a healthy weight.  Waist circumference. This measures the distance around your waistline. This measurement also tells if you are at a healthy weight and may help predict your risk of certain diseases, such as type 2 diabetes and high blood pressure.  Heart rate and blood pressure.  Body temperature.  Skin for abnormal spots.  What immunizations do I need?    Vaccines are usually given at various ages, according to a schedule. Your health care provider will recommend vaccines for you based on your age, medical history, and lifestyle or other factors, such as travel or where you work.  What tests do I need?  Screening  Your health care provider may recommend screening tests for certain conditions. This may include:  Lipid and cholesterol levels.  Diabetes screening. This is done by checking your blood sugar (glucose) after you have not eaten for a while (fasting).  Pelvic exam and Pap test.  Hepatitis B test.  Hepatitis C  test.  HIV (human immunodeficiency virus) test.  STI (sexually transmitted infection) testing, if you are at risk.  Lung cancer screening.  Colorectal cancer screening.  Mammogram. Talk with your health care provider about when you should start having regular mammograms. This may depend on whether you have a family history of breast cancer.  BRCA-related cancer screening. This may be done if you have a family history of breast, ovarian, tubal, or peritoneal cancers.  Bone density scan. This is done to screen for osteoporosis.  Talk with your health care provider about your test results, treatment options, and if necessary, the need for more tests.  Follow these instructions at home:  Eating and drinking    Eat a diet that includes fresh fruits and vegetables, whole grains, lean protein, and low-fat dairy products.  Take vitamin and mineral supplements as recommended by your health care provider.  Do not drink alcohol if:  Your health care provider tells you not to drink.  You are pregnant, may be pregnant, or are planning to become pregnant.  If you drink alcohol:  Limit how much you have to 0-1 drink a day.  Know how much alcohol is in your drink. In the [place] one drink equals one 12 oz bottle of beer (355 mL), one 5 oz glass of wine (148 mL), or one 1 oz glass of hard liquor (44 mL).  Lifestyle  Brush your teeth every morning and night with fluoride toothpaste. Floss one time each day.  Exercise for at least  30 minutes 5 or more days each week.  Do not use any products that contain nicotine or tobacco. These products include cigarettes, chewing tobacco, and vaping devices, such as e-cigarettes. If you need help quitting, ask your health care provider.  Do not use drugs.  If you are sexually active, practice safe sex. Use a condom or other form of protection to prevent STIs.  If you do not wish to become pregnant, use a form of birth control. If you plan to become pregnant, see your health care provider for a  prepregnancy visit.  Take aspirin only as told by your health care provider. Make sure that you understand how much to take and what form to take. Work with your health care provider to find out whether it is safe and beneficial for you to take aspirin daily.  Find healthy ways to manage stress, such as:  Meditation, yoga, or listening to music.  Journaling.  Talking to a trusted person.  Spending time with friends and family.  Minimize exposure to UV radiation to reduce your risk of skin cancer.  Safety  Always wear your seat belt while driving or riding in a vehicle.  Do not drive:  If you have been drinking alcohol. Do not ride with someone who has been drinking.  When you are tired or distracted.  While texting.  If you have been using any mind-altering substances or drugs.  Wear a helmet and other protective equipment during sports activities.  If you have firearms in your house, make sure you follow all gun safety procedures.  Seek help if you have been physically or sexually abused.  What's next?  Visit your health care provider once a year for an annual wellness visit.  Ask your health care provider how often you should have your eyes and teeth checked.  Stay up to date on all vaccines.  This information is not intended to replace advice given to you by your health care provider. Make sure you discuss any questions you have with your health care provider.  Document Revised: 05/14/2021 Document Reviewed: 05/14/2021  Elsevier Patient Education  2024 [employer].

## 2023-12-09 ENCOUNTER — Ambulatory Visit (INDEPENDENT_AMBULATORY_CARE_PROVIDER_SITE_OTHER): Payer: Managed Care, Other (non HMO) | Admitting: Obstetrics

## 2023-12-09 ENCOUNTER — Encounter: Payer: Self-pay | Admitting: Obstetrics

## 2023-12-09 VITALS — BP 120/80 | Ht 63.0 in | Wt 176.0 lb

## 2023-12-09 DIAGNOSIS — Z124 Encounter for screening for malignant neoplasm of cervix: Secondary | ICD-10-CM

## 2023-12-09 DIAGNOSIS — Z23 Encounter for immunization: Secondary | ICD-10-CM

## 2023-12-09 DIAGNOSIS — Z113 Encounter for screening for infections with a predominantly sexual mode of transmission: Secondary | ICD-10-CM

## 2023-12-09 DIAGNOSIS — Z01419 Encounter for gynecological examination (general) (routine) without abnormal findings: Secondary | ICD-10-CM

## 2023-12-09 DIAGNOSIS — Z1159 Encounter for screening for other viral diseases: Secondary | ICD-10-CM

## 2023-12-09 DIAGNOSIS — R87610 Atypical squamous cells of undetermined significance on cytologic smear of cervix (ASC-US): Secondary | ICD-10-CM

## 2023-12-10 LAB — HEPATITIS C ANTIBODY: Hep C Virus Ab: NONREACTIVE

## 2023-12-13 ENCOUNTER — Ambulatory Visit
Admission: RE | Admit: 2023-12-13 | Discharge: 2023-12-13 | Disposition: A | Discharge status: Home / Self Care | Payer: Managed Care, Other (non HMO) | Source: Ambulatory Visit | Attending: Nurse Practitioner | Admitting: Nurse Practitioner

## 2023-12-13 DIAGNOSIS — Z1231 Encounter for screening mammogram for malignant neoplasm of breast: Secondary | ICD-10-CM | POA: Diagnosis present

## 2023-12-15 ENCOUNTER — Encounter: Payer: Self-pay | Admitting: Obstetrics

## 2023-12-15 LAB — IGP, APTIMA HPV: HPV Aptima: NEGATIVE

## 2024-01-26 ENCOUNTER — Encounter: Payer: Managed Care, Other (non HMO) | Admitting: Nurse Practitioner
